# Patient Record
Sex: Female | Born: 1955 | Race: White | Hispanic: No | Marital: Married | Smoking: Current every day smoker
Health system: Southern US, Community
[De-identification: ages and names within clinical notes are randomized; demographics above are authoritative.]

## PROBLEM LIST (undated history)

## (undated) DIAGNOSIS — M1990 Unspecified osteoarthritis, unspecified site: Secondary | ICD-10-CM

## (undated) HISTORY — PX: TUBAL LIGATION: SHX77

## (undated) HISTORY — PX: BACK SURGERY: SHX140

---

## 2001-08-13 ENCOUNTER — Other Ambulatory Visit: Admission: RE | Admit: 2001-08-13 | Discharge: 2001-08-13 | Payer: Self-pay | Admitting: Obstetrics & Gynecology

## 2001-09-11 ENCOUNTER — Ambulatory Visit (HOSPITAL_COMMUNITY): Admission: RE | Admit: 2001-09-11 | Discharge: 2001-09-11 | Payer: Self-pay | Admitting: Family Medicine

## 2001-09-11 ENCOUNTER — Encounter: Payer: Self-pay | Admitting: Family Medicine

## 2001-10-18 ENCOUNTER — Ambulatory Visit (HOSPITAL_COMMUNITY): Admission: RE | Admit: 2001-10-18 | Discharge: 2001-10-18 | Payer: Self-pay | Admitting: Obstetrics & Gynecology

## 2002-08-17 ENCOUNTER — Emergency Department (HOSPITAL_COMMUNITY): Admission: EM | Admit: 2002-08-17 | Discharge: 2002-08-17 | Payer: Self-pay | Admitting: Emergency Medicine

## 2002-08-18 ENCOUNTER — Inpatient Hospital Stay (HOSPITAL_COMMUNITY): Admission: EM | Admit: 2002-08-18 | Discharge: 2002-08-22 | Payer: Self-pay | Admitting: Psychiatry

## 2003-07-07 ENCOUNTER — Ambulatory Visit (HOSPITAL_COMMUNITY): Admission: RE | Admit: 2003-07-07 | Discharge: 2003-07-07 | Payer: Self-pay | Admitting: Family Medicine

## 2004-01-30 ENCOUNTER — Ambulatory Visit (HOSPITAL_COMMUNITY): Admission: RE | Admit: 2004-01-30 | Discharge: 2004-01-30 | Payer: Self-pay | Admitting: General Surgery

## 2004-03-23 ENCOUNTER — Inpatient Hospital Stay (HOSPITAL_COMMUNITY): Admission: EM | Admit: 2004-03-23 | Discharge: 2004-03-26 | Payer: Self-pay | Admitting: Emergency Medicine

## 2004-04-06 ENCOUNTER — Ambulatory Visit (HOSPITAL_COMMUNITY): Admission: RE | Admit: 2004-04-06 | Discharge: 2004-04-06 | Payer: Self-pay | Admitting: Internal Medicine

## 2004-04-26 ENCOUNTER — Ambulatory Visit: Payer: Self-pay | Admitting: Orthopedic Surgery

## 2004-04-27 ENCOUNTER — Encounter (HOSPITAL_COMMUNITY): Admission: RE | Admit: 2004-04-27 | Discharge: 2004-05-27 | Payer: Self-pay | Admitting: Orthopedic Surgery

## 2004-06-01 ENCOUNTER — Encounter (HOSPITAL_COMMUNITY): Admission: RE | Admit: 2004-06-01 | Discharge: 2004-07-01 | Payer: Self-pay | Admitting: Orthopedic Surgery

## 2004-06-07 ENCOUNTER — Ambulatory Visit: Payer: Self-pay | Admitting: Orthopedic Surgery

## 2004-07-07 ENCOUNTER — Ambulatory Visit: Payer: Self-pay | Admitting: Orthopedic Surgery

## 2004-07-12 ENCOUNTER — Ambulatory Visit (HOSPITAL_COMMUNITY): Admission: RE | Admit: 2004-07-12 | Discharge: 2004-07-12 | Payer: Self-pay | Admitting: Orthopedic Surgery

## 2004-07-19 ENCOUNTER — Ambulatory Visit: Payer: Self-pay | Admitting: Orthopedic Surgery

## 2004-08-16 ENCOUNTER — Ambulatory Visit: Payer: Self-pay | Admitting: Orthopedic Surgery

## 2004-08-24 ENCOUNTER — Encounter: Admission: RE | Admit: 2004-08-24 | Discharge: 2004-08-24 | Payer: Self-pay | Admitting: Neurosurgery

## 2004-09-20 ENCOUNTER — Ambulatory Visit: Payer: Self-pay | Admitting: Orthopedic Surgery

## 2004-09-28 ENCOUNTER — Encounter (HOSPITAL_COMMUNITY): Admission: RE | Admit: 2004-09-28 | Discharge: 2004-10-28 | Payer: Self-pay | Admitting: Neurology

## 2004-10-25 ENCOUNTER — Ambulatory Visit: Payer: Self-pay | Admitting: Orthopedic Surgery

## 2004-11-02 ENCOUNTER — Encounter (HOSPITAL_COMMUNITY): Admission: RE | Admit: 2004-11-02 | Discharge: 2004-12-02 | Payer: Self-pay | Admitting: Neurology

## 2004-11-18 ENCOUNTER — Ambulatory Visit: Payer: Self-pay | Admitting: Orthopedic Surgery

## 2005-01-16 ENCOUNTER — Emergency Department (HOSPITAL_COMMUNITY): Admission: EM | Admit: 2005-01-16 | Discharge: 2005-01-16 | Payer: Self-pay | Admitting: Emergency Medicine

## 2006-05-30 ENCOUNTER — Ambulatory Visit (HOSPITAL_COMMUNITY): Admission: RE | Admit: 2006-05-30 | Discharge: 2006-05-30 | Payer: Self-pay | Admitting: Family Medicine

## 2006-05-31 ENCOUNTER — Ambulatory Visit (HOSPITAL_COMMUNITY): Admission: RE | Admit: 2006-05-31 | Discharge: 2006-05-31 | Payer: Self-pay | Admitting: Family Medicine

## 2006-06-19 ENCOUNTER — Ambulatory Visit (HOSPITAL_COMMUNITY): Admission: RE | Admit: 2006-06-19 | Discharge: 2006-06-19 | Payer: Self-pay | Admitting: Pulmonary Disease

## 2007-11-02 ENCOUNTER — Emergency Department (HOSPITAL_COMMUNITY): Admission: EM | Admit: 2007-11-02 | Discharge: 2007-11-02 | Payer: Self-pay | Admitting: Emergency Medicine

## 2008-03-20 ENCOUNTER — Ambulatory Visit (HOSPITAL_COMMUNITY): Admission: RE | Admit: 2008-03-20 | Discharge: 2008-03-20 | Payer: Self-pay | Admitting: Internal Medicine

## 2008-07-17 ENCOUNTER — Ambulatory Visit (HOSPITAL_COMMUNITY): Admission: RE | Admit: 2008-07-17 | Discharge: 2008-07-17 | Payer: Self-pay | Admitting: Family Medicine

## 2010-03-09 ENCOUNTER — Emergency Department (HOSPITAL_COMMUNITY): Admission: EM | Admit: 2010-03-09 | Discharge: 2010-03-09 | Payer: Self-pay | Admitting: Emergency Medicine

## 2010-03-18 ENCOUNTER — Emergency Department (HOSPITAL_COMMUNITY): Admission: EM | Admit: 2010-03-18 | Discharge: 2010-03-18 | Payer: Self-pay | Admitting: Emergency Medicine

## 2010-07-10 ENCOUNTER — Encounter: Payer: Self-pay | Admitting: Internal Medicine

## 2010-07-11 ENCOUNTER — Encounter: Payer: Self-pay | Admitting: Internal Medicine

## 2010-09-02 LAB — DIFFERENTIAL
Basophils Absolute: 0 10*3/uL (ref 0.0–0.1)
Basophils Absolute: 0.1 10*3/uL (ref 0.0–0.1)
Basophils Relative: 1 % (ref 0–1)
Eosinophils Absolute: 0.1 10*3/uL (ref 0.0–0.7)
Eosinophils Relative: 1 % (ref 0–5)
Monocytes Absolute: 0.2 10*3/uL (ref 0.1–1.0)
Neutro Abs: 3.4 10*3/uL (ref 1.7–7.7)
Neutrophils Relative %: 61 % (ref 43–77)

## 2010-09-02 LAB — COMPREHENSIVE METABOLIC PANEL
ALT: 10 U/L (ref 0–35)
AST: 16 U/L (ref 0–37)
CO2: 25 mEq/L (ref 19–32)
Chloride: 103 mEq/L (ref 96–112)
GFR calc Af Amer: >60 mL/min (ref >60)
GFR calc non Af Amer: >60 mL/min (ref >60)
Potassium: 3.5 mEq/L (ref 3.5–5.1)
Sodium: 131 mEq/L — ABNORMAL LOW (ref 135–145)
Total Bilirubin: 0.4 mg/dL (ref 0.3–1.2)

## 2010-09-02 LAB — CBC
HCT: 37.6 % (ref 36.0–46.0)
Hemoglobin: 11 g/dL — ABNORMAL LOW (ref 12.0–15.0)
MCHC: 33.9 g/dL (ref 30.0–36.0)
Platelets: 170 10*3/uL (ref 150–400)
RBC: 3.42 MIL/uL — ABNORMAL LOW (ref 3.87–5.11)
RDW: 14.4 % (ref 11.5–15.5)

## 2010-09-02 LAB — BASIC METABOLIC PANEL
BUN: 9 mg/dL (ref 6–23)
CO2: 25 mEq/L (ref 19–32)
Calcium: 9.6 mg/dL (ref 8.4–10.5)
Chloride: 109 mEq/L (ref 96–112)
Creatinine, Ser: 0.82 mg/dL (ref 0.4–1.2)
GFR calc Af Amer: >60 mL/min (ref >60)
GFR calc non Af Amer: >60 mL/min (ref >60)
Glucose, Bld: 91 mg/dL (ref 70–99)
Potassium: 3.9 mEq/L (ref 3.5–5.1)
Sodium: 141 mEq/L (ref 135–145)

## 2010-09-02 LAB — BLOOD GAS, ARTERIAL
Patient temperature: 37
TCO2: 21.6 mmol/L (ref 0–100)
pH, Arterial: 7.395 (ref 7.350–7.400)

## 2010-09-02 LAB — URINALYSIS, ROUTINE W REFLEX MICROSCOPIC
Glucose, UA: NEGATIVE mg/dL
Ketones, ur: 15 mg/dL — AB
Nitrite: NEGATIVE
pH: 6 (ref 5.0–8.0)

## 2010-09-02 LAB — URINE MICROSCOPIC-ADD ON

## 2010-09-02 LAB — D-DIMER, QUANTITATIVE: D-Dimer, Quant: 0.57 ug/mL-FEU — ABNORMAL HIGH (ref 0.00–0.48)

## 2010-09-02 LAB — LIPASE, BLOOD: Lipase: 21 U/L (ref 11–59)

## 2010-11-05 NOTE — H&P (Signed)
[facility]  Patient:    Valerie Bradley, Valerie Bradley Visit Number: [id number] MRN: [id number]          Service Type: OUT Location: [facility] Attending Physician:  Kirk Ruths Dictated by:   Duane Lope, M.D. Admit Date:  09/11/2001 Discharge Date: 09/11/2001                           History and Physical  DATE OF BIRTH:  02-06-1956  HISTORY OF PRESENT ILLNESS:  Valerie Bradley is a 55 year old, white female, G3, P3, status post tubal ligation in 1982, who is admitted for a hysteroscopy, D&C and endometrial ablation.  She was seen originally in our office on February 24, for perimenopausal dysfunctional bleeding, hypermenorrhea and dysmenorrhea.  She had an endometrial biopsy which basically showed disturbed phase endometrium with no hyperplasia consistent with dysfunctional uterine bleeding.  I put her on cyclical Provera as well as nonsteroidal antiinflammatory medications and was to follow her up in three months. However, she failed that therapy and presented as a work-in on September 25, 2001, at which time she was basically bleeding all the time on the Provera worse than she even was before.  Her cramps also remained pretty extreme despite the nonsteroidals.  As a result, she is admitted for the above-mentioned procedure.  PAST MEDICAL HISTORY:  Negative except for perimenstrual migraines.  PAST SURGICAL HISTORY: 1. Tubal ligation. 2. Tennis elbow surgery.  PAST OBSTETRIC HISTORY:  Three vaginal deliveries.  PAST GYNECOLOGIC HISTORY:  Sometimes her clotting can be the size of tennis balls and her cramps basically put her in the bed.  REVIEW OF SYSTEMS:  Otherwise negative, except the patient only has one kidney and was found at time of workup for numerous kidney infections.  FAMILY HISTORY:  Thyroid disease.  Epilepsy.  PHYSICAL EXAMINATION:  VITAL SIGNS:  Weight 110 pounds, blood pressure 120/80.  Her hemoglobin in February was 13.1.  By the first of  April, it had dropped to 11.5.  HEENT:  Unremarkable with normal thyroid.  LUNGS:  Clear.  HEART:  Regular rate and rhythm without murmurs, rubs or gallops.  BREASTS:  Without mass, discharge or skin changes.  ABDOMEN:  Benign hepatosplenomegaly or mass.  PELVIC:  She has normal external genitalia.  Cervix is parous without lesions. Uterus normal size, shape and contour.  Ovaries normal and nontender.  EXTREMITIES:  No edema.  NEUROLOGIC:  Grossly intact.  IMPRESSION: 1. Perimenopausal dysfunctional bleeding unresponsive to conservative    measures. 2. Anemia. 3. Dysmenorrhea.  PLAN:  The patient is admitted as an outpatient for outpatient surgery for a hysteroscopy, D&C and endometrial ablation.  She understands the risks, benefits, indications and alternatives for the procedure and will proceed. Dictated by:   Duane Lope, M.D. Attending Physician:  Kirk Ruths DD:  10/17/01 TD:  10/17/01 Job: [id number]

## 2010-11-05 NOTE — Op Note (Signed)
NAME:  Valerie Bradley, Valerie Bradley                          ACCOUNT NO.:  [id number]   MEDICAL RECORD NO.:  [id number]                   PATIENT TYPE:  AMB   LOCATION:  DAY                                  FACILITY:  [facility]   PHYSICIAN:  Barbaraann Barthel, M.D.              DATE OF BIRTH:  1955/10/12   DATE OF PROCEDURE:  01/30/2004  DATE OF DISCHARGE:                                 OPERATIVE REPORT   SURGEON:  Barbaraann Barthel, M.D.   PREOPERATIVE DIAGNOSIS:  Hidradenitis suppurativa, left axilla.   POSTOPERATIVE DIAGNOSIS:  Hidradenitis suppurativa, left axilla.   PROCEDURE:  Excision of hidradenitis suppurativa, left axilla.   SPECIMENS:  Cultures obtained and approximately a 6 x 4 area of excised  axillary tissue with inflamed apocrine glands.   INDICATIONS FOR PROCEDURE:  This is a 55 year old white female who presented  with a localized case of hidradenitis suppurativa of her left axilla.  She  was seen by the medical service, placed on antibiotics, and referred to  surgery.   We had planned for an excision of this area in the operating room and  discussed this in detail with the family.  We discussed complications, not  limited to bleeding, infection, and the possibility that more surgery may be  required.  Informed consent was obtained.   TECHNIQUE:  The patient was placed in the right lateral decubitus position  with her left arm elevated after adequate administration of LMA anesthesia.  The area was prepped with Betadine solution and draped in the usual manner.   The areas of suppuration were cultured, and cultures were sent.  An  elliptical incision was carried out around the area of localized  hidradenitis suppurativa.  We controlled the bleeding with the cautery  device and then irrigated this area and placed a saline-soaked 4 x 4 in this  area with an ABD pad as a dressing.  No drains were placed otherwise.  No  primary closure was attempted.   PLAN:  We will continue local  care until there is granulation tissue that is  adequate to accept a skin graft.  I have discussed this with the family.  Possibly, this may heal by secondary intention.  At any rate, we will  continue with  wound care.  The family is very familiar with this, as they have had similar  type of problems in the past.  At any rate, I will follow them closely  postoperatively after which they will return medically to Dr. Sherwood Gambler for any  problems they may have in the future.      ___________________________________________                                            Barbaraann Barthel, M.D.   WB/MEDQ  D:  01/30/2004  T:  01/30/2004  Job:  [id number]   cc:   Madelin Rear. Sherwood Gambler, M.D.  [address]  [place]  [place] [address]  Fax: [phone]

## 2010-11-05 NOTE — H&P (Signed)
NAME:  Valerie Bradley, Valerie Bradley NO.:  [id number]   MEDICAL RECORD NO.:  [id number]                   PATIENT TYPE:  IPS   LOCATION:  0300                                 FACILITY:  [facility]   PHYSICIAN:  Geoffery Lyons, M.D.                   DATE OF BIRTH:  11/12/1955   DATE OF ADMISSION:  08/18/2002  DATE OF DISCHARGE:                         PSYCHIATRIC ADMISSION ASSESSMENT   IDENTIFYING INFORMATION:  This is a voluntary admission for this 55 year old  married white female.  Identifying information is the patient and  accompanying records.   REASON FOR ADMISSION AND SYMPTOMS:  The patient reports depressive symptoms  as well as tension headaches for years.  She has been prescribed pain  medication and has been abusing them for the past 3 years.  She acknowledges  abusing Valium for the past 5 out of 11 years by taking 7 per day, also 7  Fioricet per day.  She takes her Lortabs 3 per day every day even if her  head is not hurting.  The patient reports being depressed from stressors  within her family, her marriage and her employment.  She has entertained  suicidal ideation recently but not at present.  She reports that last week  she decided that she needed to start weaning herself from her medications  and was cutting her Vicodin in half.  She was experiencing withdrawal  symptoms.  She was having stomach cramps and was irritable.  Apparently a  week ago, she had a migraine that lasted the whole weekend.  She tried to  get her Fioricet renewed.  Dr. Edison Simon office said that she had to come  in.  She went to Dr. Edison Simon office Monday but did not tell him she has  been continuing to get Vicodin and take Vicodin.  Apparently on Tuesday, a  complaint was made at her place of employment.  She is a school bus driver,  and on Thursday the patient woke up with severe stomach pains, diarrhea.  She states she took Imodium.  She also took double her dose of Vicodin so  that she would be able to drive the bus.  This was the day the snowstorm  came in and apparently she was asked to come off the bus and provide a urine  specimen.  The patient reports that she has had no promotion after 16 years  of service. She feels that there is favoritism.  There are younger  supervisors, etc.  She is requesting help to discontinue these medications  as she realizes that she has been abusing them and has become dependent.   PAST PSYCHIATRIC HISTORY:  She declares that she has never taken  antidepressants, nor has she had formal psychiatric care.  Her records  indicate that apparently at some point in time, a number of years ago, she  did have some panic attacks, but not  recently.   FAMILY HISTORY:  She has a brother who has a polysubstance abuse addiction.  She has a brother who has an alcohol problem, and another brother who uses  marijuana, and also a brother who has been diagnosed as bipolar.  She  herself has had no issues with alcohol.  She does smoke 1-1/2 packs of  cigarettes a day for 30 years.   SOCIAL HISTORY:  She has been married for 28 years.  She finished high  school.  She has worked as a Midwife for 16 years.  She has one son, 70-  1/2, another son 82, and a daughter 47.  She is currently having  relationship issues with her 38 year old daughter over choices she makes.  There is some stress within the marriage regarding a friendship she has with  another bus driver who is female.   PAST MEDICAL HISTORY:  Primary care Sebastian Dzik is Dr. Regino Schultze in [place].  Medical problems:  She only has one kidney.  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  Last April, she  underwent a D&C.  She was also told she had iron deficiency anemia by Dr.  Arbie Cookey and was prescribed iron replacement.   CURRENT MEDICATIONS:  Fioricet 325/40 mg 1 tab 4-6 hours as needed for  headaches, Lorcet 10/650 1-2 tabs every 4 hours, and  Valium 5 mg 1 tab 3  times each day.   ALLERGIES:  No known drug allergies.   POSITIVE PHYSICAL FINDINGS:  The patient is status post repair of tennis  elbow on the right in 1996 and she was seen last April and was found to have  iron deficiency anemia.   REVIEW OF SYSTEMS:  She has chronic tension headache.  She has undergone  several evaluations and tests have all come back negative, hence she has  been prescribed the Valium and the Fioricet.  She is known to have a left  eye astigmatism.  She has had pneumonia x2.  Her stomach hurts at times and  cramps, but no diarrhea or constipation.  The remainder of her review of  systems is unremarkable.  SKIN:  Her skin is dry but otherwise is intact.   PHYSICAL EXAMINATION:  A very thin, white female who appears older than her  stated age.  SKIN:  Wrinkled, consistent with her tobacco use.  HEENT:  Remarkable for the fact that she has had all her teeth extracted and  is fully compensated with dentures.  CHEST:  Clear to auscultation and percussion.  HEART:  Regular rate and rhythm without murmurs, rubs or gallops.  ABDOMEN:  Soft, no organomegaly, bowel sounds were present.  MUSCULOSKELETAL SYSTEM:  Reveals no clubbing, cyanosis, edema.  Pulses were  intact.  No bruits were noted.  NEUROLOGICALLY:  Cranial nerves II-XII are grossly intact.  Specifically,  she is not exhibiting tremor or other withdrawal symptoms at the moment.   MENTAL STATUS EXAM:  She is alert and oriented x3.  She is neatly dressed  and groomed.  She is somewhat anorectic appearing and does appear older than  her stated age.  Her speech was soft, with a normal rate and rhythm.  It had  no psychotic content.  Her mood is depressed, she is crying, she is somewhat  confused.  Her thought process is slightly scattered, with some persecutory overtones, but she denies auditory or visual hallucinations.  Cognitively,  her memory is intact, her  concentration is intact, she  has average  intelligence, and she has concrete abstractions.   ADMISSION DIAGNOSES:   AXIS I:  1. Major depressive disorder.  2. Prescription pain dependence/abuse.   AXIS II:  Deferred.   AXIS III:  Tension headache, recent iron deficiency anemia, status post  repair of tennis elbow.   AXIS IV:  Severe.  She is having problems with primary support group and  occupational problems.   AXIS V:  Global assessment of function is currently 43, in the past year she  is 80.    PLAN:  Admit for help with withdrawal from the prescription pain medications  that she has been abusing.  We will check her labs and continue to treat her  iron deficiency anemia if that is still a problem.  We will find other  methods to treat her tension headaches and we will have a social services  meeting with her husband, as well as contacting her place of employment, the  [employer] program there so that she will not lose her 16 years experience.      Mickie Adams- Deery, P.A.-C.              Geoffery Lyons, M.D.    MA/MEDQ  D:  08/18/2002  T:  08/18/2002  Job:  [id number]

## 2010-11-05 NOTE — Consult Note (Signed)
NAME:  REIS, PIENTA NO.:  [id number]   MEDICAL RECORD NO.:  [id number]            PATIENT TYPE:   LOCATION:                                 FACILITY:   PHYSICIAN:  Vickki Hearing, M.D.DATE OF BIRTH:  04/20/2006   DATE OF CONSULTATION:  03/25/2004  DATE OF DISCHARGE:                                   CONSULTATION   REQUESTING PHYSICIAN:  Madelin Rear. Sherwood Gambler, M.D.   CHIEF COMPLAINT:  Back and neck pain.   HISTORY:  This is a 55 year old female who fell off a horse, sustained an  injury to her left chest; had a flexion position of her spine when the horse  hit her in the head.  She had a questionable loss of consciousness with  dizziness.  She complains of midthoracic back pain and neck pain.  She got  back on the horse, rode it back to the barn and then presented to the office  when the pain did not go away.  She denies any radicular pain or neurologic  symptoms, although she had some nausea.  She does have a headache.  She has  no weakness in the upper extremities.   PAST, FAMILY, SOCIAL HISTORY, AND REVIEW OF SYSTEMS:  Otherwise  noncontributory.   PHYSICAL EXAMINATION:  GENERAL:  She is well-developed and nourished.  Grooming and hygiene are normal.  She is very thin.  She has a mild  kyphosis.  CARDIOVASCULAR EXAM:  Shows no swelling or varicosities.  Pulses are normal.  EXTREMITIES:  Warm without edema or tenderness.  LYMPH NODES:  Cervical lymph nodes are benign.  BACK:  Gait and station are normal except for the kyphotic posture in the  spine.  Tenderness is noted in the midthoracic region approximately T4-7.  She has decreased spinal extension, increased spinal flexion at the T4-7  level.  Her spine; however, is stable.  Muscle tone is normal.  Upper  extremities show no evidence of tenderness, loss of motion, instability,  muscle tone, or strength deficit.  SKIN:  Normal.  NEUROLOGIC:  Exam is intact.  She is awake and alert.  There is no  anxiety.   RADIOGRAPHS:  Include cervical spine x-ray, thoracic spine x-ray, MRI C-  spine and thoracic spine.  She has a C5-C6 protruding disk with no  neurologic compression.  There is some pressure on the thecal sac.  The  nerve roots are clean.  There is a 20-30% compression fracture at T4.  There  is some edema in the thoracic vertebrae numbers 3 and 2.   DIAGNOSES:  Compression fracture thoracic spine at T4, C5-6 disk   RECOMMEND:  A CASH brace and cervical collar.  Follow up with me in 6 weeks  for x-rays.     Weyman Croon   SEH/MEDQ  D:  03/25/2004  T:  03/25/2004  Job:  [id number]   cc:   Madelin Rear. Sherwood Gambler, MD  [address]  [place]  [place] [address]  Fax: [phone]   Vickki Hearing, M.D.  Fax: [phone]

## 2010-11-05 NOTE — Op Note (Signed)
[facility]  Patient:    Valerie Bradley, Valerie Bradley Visit Number: [id number] MRN: [id number]          Service Type: DSU Location: DAY Attending Physician:  Lazaro Arms Dictated by:   Duane Lope, M.D. Proc. Date: 10/18/01 Admit Date:  10/18/2001                             Operative Report  PREOPERATIVE DIAGNOSES:  1. Perimenopausal dysfunctional uterine bleeding uncontrolled by     conservative measures.  2. Dysmenorrhea.  3. Anemia.  POSTOPERATIVE DIAGNOSES:  1. Perimenopausal dysfunctional uterine bleeding uncontrolled by     conservative measures.  2. Dysmenorrhea.  3. Anemia.  PROCEDURE:  Hysteroscopy D&C with a thermal endometrial oblation.  SURGEON:  Duane Lope, M.D.  ANESTHESIA:  Laryngeal mask airway general.  FINDINGS:  The patient had no polyps or submucosal myomas hysteroscopy. She had sort of a fluffy endometrium with quite a large amount of curettings all of which appeared to be normal despite having been on Provera. Again there were no areas of suspicion on endometrium.  DESCRIPTION OF PROCEDURE:  The patient was taken to the operating room and placed in the supine position where she underwent laryngeal mask airway and general anesthesia. She was placed in the dorsal lithotomy position and prepped and draped in the usual sterile fashion. Her bladder was drained, a speculum was placed. Half percent Marcaine plain was placed as a paracervical block bilaterally, a total of 24 cc. The cervix was grasped with a single tooth tenaculum. The cervix was dilated serially. After uterine sounding took place, it sounded to 8 cm. This was done to allow the passage of a 5 mm hysteroscope. Normal saline was used for a distending media. The above noted hysteroscopic findings were noted and pictures were taken. Again there were no abnormalities. The cervix was then dilated serially to allow passage of a medium to large size uterine curette. A vigorous  uterine curettage was performed obtaining good cry in all areas. Once this was completed there was good hemostasis. The endometrial oblation was then performed using the thermochoice instrument. The catheter was primed, taken to a negative pressure of -150 and then it require 15 cc of fluid as it was placed in the endometrial cavity in the balloon to maintain a pressure of approximately 170 preheating and then the contents of the balloon were heated to a temperature of 87 degrees celsius for a total period of 8 minutes. Total therapy time was 8 minutes and 59 seconds. This was allowed to cool down. The balloon was evacuated and removed from the uterine cavity. The patient tolerated the procedure well. She experienced minimal blood loss and was taken to the recovery room in good stable condition. All counts were correct. Dictated by:   Duane Lope, M.D. Attending Physician:  Lazaro Arms DD:  10/18/01 TD:  10/19/01 Job: [id number]

## 2010-11-05 NOTE — Discharge Summary (Signed)
NAME:  Valerie Bradley, MELNIK NO.:  [id number]   MEDICAL RECORD NO.:  [id number]                   PATIENT TYPE:  IPS   LOCATION:  0300                                 FACILITY:  [facility]   PHYSICIAN:  Jeanice Lim, M.D.              DATE OF BIRTH:  1956-04-04   DATE OF ADMISSION:  08/18/2002  DATE OF DISCHARGE:  08/22/2002                                 DISCHARGE SUMMARY   IDENTIFYING DATA:  This is a 55 year old Caucasian female presenting for  help to detoxify off of Vicodin, Fioricet, Valium, which she had been taking  for 11 years.  She had been abusing medications and experiencing withdrawal  symptoms.  She was feeling desperate and unable to stop medications in the  outpatient setting safely.   MEDICATIONS:  1. Valium 5 mg, up to seven a day.  2. Vicodin q.4h.  3. Fioricet one to two q.4h.  The patient had been taking more than     prescribed.   DRUG ALLERGIES:  No known drug allergies.   PHYSICAL EXAMINATION:  GENERAL:  Essentially within normal limits.  NEUROLOGIC:  Nonfocal.   LABORATORY DATA:  Routine admission labs: CBC: Within normal limits.  CMET:  Within normal limits except for a slightly elevated glucose of 111.  Liver  function tests were within normal limits.  TSH: Within normal limits.   MENTAL STATUS EXAM:  Alert and oriented x 3, neatly dressed, well groomed  female.  Speech was within normal limits.  Mood: Depressed, tearful.  Affect: Somewhat labile.  Thought process: Somewhat scattered.  The patient  reported feeling uncomfortable, some withdrawal symptoms.  Cognitive:  Intact.  Judgment and insight: Fair.   ADMISSION DIAGNOSES:   AXIS I:  1. Major depressive disorder.  2. Opiate dependence.   AXIS II:  None.   AXIS III:  1. Tension headache.  2. Recent iron-deficiency anemia.   AXIS IV:  Moderate problems with occupation and primary support system.   AXIS V:  40/70   HOSPITAL COURSE:  The patient was admitted,  ordered routine p.r.n.  medications, underwent further monitoring, and was encouraged to participate  in individual, group, and milieu therapy.  The patient was placed on  detoxification protocol for opiates and benzodiazepines and was monitored  for withdrawal symptoms.  The patient reported feeling guilty about having  hurt husband, reported tolerating detoxification without significant  complications.  Withdrawal symptoms gradually decreased in severity.  The  patient admitted to being depressed and was quite motivated to remain sober  and was willing to follow up with the [facility] after discharge.  The patient reported a positive  response to clinical intervention.   CONDITION ON DISCHARGE:  Her condition on discharge was markedly improved.  Mood was more euthymic.  Affect: Brighter.  Thought processes: Goal  directed.  Thought content: Negative for dangerous  ideation or psychotic  symptoms.  The patient was motivation to be abstinent from substances of  abuse and had no acute withdrawal symptoms.   DISCHARGE MEDICATIONS:  1. Zoloft 50 mg q.a.m.  2. Librium 25 mg t.i.d. for one day, then b.i.d. for one day, then one at     night for one day, then stop.  3. Trazodone 100 mg q.h.s.  4. Midrin two capsules p.r.n. onset of headache and one every two hours up     to a maximum of six in a 24 hour period.   FOLLOW UP:  The patient was to follow up with the [facility] clinic on March 5  at 4 p.m.   DISCHARGE DIAGNOSES:   AXIS I:  1. Major depressive disorder.  2. Opiate dependence.   AXIS II:  None.   AXIS III:  1. Tension headache.  2. Recent iron-deficiency anemia.   AXIS IV:  Moderate problems with occupation and primary support system.   AXIS V:  Global assessment of functioning on discharge was 55.                                               Jeanice Lim, M.D.    JEM/MEDQ  D:  09/10/2002  T:  09/10/2002  Job:   [id number]

## 2010-11-05 NOTE — H&P (Signed)
NAME:  Valerie Bradley, Valerie Bradley                ACCOUNT NO.:  [id number]   MEDICAL RECORD NO.:  [id number]          PATIENT TYPE:  EMS   LOCATION:  ED                            FACILITY:  [facility]   PHYSICIAN:  Madelin Rear. Fusco, M.D.DATE OF BIRTH:  03/17/1956   DATE OF ADMISSION:  03/23/2004  DATE OF DISCHARGE:  LH                                HISTORY & PHYSICAL   CHIEF COMPLAINT:  Back pain, neck pain.   HISTORY OF PRESENT ILLNESS:  Several days prior to office visit, the patient  fell off of a horse sustaining an injury to her left chest.  She also had  the horse's head come up and abruptly strike her on the frontal eminence  with a quasi loss of consciousness and dizziness.  Following that, a fall  occurred, where she strained her neck and hurt her back.  She was able to  get back on the horse and ride it back to the barn.  However, she had a  persistent pain and presented to the office in that fashion.  She denied any  radicular pain or neurologic symptoms, although she did have some nausea.  She has a persistent headache as well.   PAST MEDICAL HISTORY:  Noncontributory.   SOCIAL HISTORY:  Noncontributory.   FAMILY HISTORY:  Noncontributory.   REVIEW OF SYSTEMS:  Under HPI.  All else is negative.   PHYSICAL EXAMINATION:  GENERAL:  She is awake and alert, although somewhat  bradykinetic secondary to obvious headache.  HEENT:  She has a small, soft tissue swelling over the glabella and right  frontal eminence.  No hematoma was palpable.  NECK:  Limited range of motion in the office with persistent pain in all  range of motion testing.  UPPER EXTREMITIES:  DTRs, motor and sensory examinations as well as the  lower extremities were normal.  CHEST:  Left rib tenderness over the left lateral lower chest wall.  No  crepitus.  Breath sounds were clear bilaterally.  CARDIAC:  Regular rhythm without murmurs, gallops or rubs.  ABDOMEN:  Soft.  No organomegaly or masses.  No guarding, rebound  or  tenderness.  MUSCULOSKELETAL:  Point tenderness over the T4 spinous process.   LABORATORY STUDIES:  The patient was sent to the emergency department for  urgent lab studies.  A CT of the head was negative for acute intracranial  process.  Plain cervical C spine series were negative.  T spine series did  show a compression wedge deformity at T4 with no clear cut edema.  CT of the  chest showed no acute disease, per radiology interpretation.  Blood work was  obtained, which was negative.  The amylase and lipase are pending at  present.  Urinalysis is also pending.   IMPRESSION:  1.  Multiple contusions with T4 compression fracture - Admit for pain      control, MRI.  Rule out cervicogenic disc disease with another MRI of      the neck, planned for the morning if possible.  2.  Closed head injury - Monitor neurologic signs and exam.  P.r.n.      analgesia.      LJF/MEDQ  D:  03/23/2004  T:  03/23/2004  Job:  [id number]

## 2010-11-05 NOTE — Discharge Summary (Signed)
NAME:  Valerie Bradley, Valerie Bradley                ACCOUNT NO.:  [id number]   MEDICAL RECORD NO.:  [id number]          PATIENT TYPE:  INP   LOCATION:  A339                          FACILITY:  [facility]   PHYSICIAN:  Madelin Rear. Sherwood Gambler, MD  DATE OF BIRTH:  Feb 11, 1956   DATE OF ADMISSION:  03/23/2004  DATE OF DISCHARGE:  10/07/2005LH                                 DISCHARGE SUMMARY   DISCHARGE DIAGNOSES:  1.  Fracture of T spine, compression type, closed.  2.  Cervicogenic disk disease.  3.  Multiple contusions including chest wall and possible occult rib      fractures.  4.  Closed-head injury with postconcussive symptomatology.   DISCHARGE MEDICATIONS:  1.  Valium 10 mg q. 6h. p.r.n.  2.  Lorcet 10 mg q.6h. p.r.n.   SUMMARY:  The patient was a rider of a horse, sustaining an injury to her  left chest after being hit in the head by the horses head.  She had possibly  a brief loss of consciousness, at least antegrade amnesia for the event, and  persisted with severe pain in the back, left chest, headache, malaise, and  nausea.  She was admitted for observation to rule out subdural hematoma or  other cranial pathology as well as internal injuries from the fall.  She  responded well to interventions with analgesia and serial assessments being  negative.  X-rays revealed fractures of T4, compression type, and she was  seen in consultation by orthopedics for that.   She is discharged for followup in our office after adequate analgesia was  obtained orally.     Lawr   LJF/MEDQ  D:  04/09/2004  T:  04/09/2004  Job:  [id number]

## 2012-12-03 ENCOUNTER — Other Ambulatory Visit (HOSPITAL_COMMUNITY): Payer: Self-pay | Admitting: Internal Medicine

## 2012-12-03 DIAGNOSIS — S7001XA Contusion of right hip, initial encounter: Secondary | ICD-10-CM

## 2012-12-11 ENCOUNTER — Ambulatory Visit (HOSPITAL_COMMUNITY)
Admission: RE | Admit: 2012-12-11 | Discharge: 2012-12-11 | Disposition: A | Discharge status: Home / Self Care | Payer: BC Managed Care – PPO | Source: Ambulatory Visit | Attending: Internal Medicine | Admitting: Internal Medicine

## 2012-12-11 DIAGNOSIS — S7001XA Contusion of right hip, initial encounter: Secondary | ICD-10-CM

## 2012-12-11 DIAGNOSIS — M25559 Pain in unspecified hip: Secondary | ICD-10-CM | POA: Insufficient documentation

## 2012-12-11 DIAGNOSIS — S7000XA Contusion of unspecified hip, initial encounter: Secondary | ICD-10-CM | POA: Insufficient documentation

## 2012-12-11 DIAGNOSIS — X58XXXA Exposure to other specified factors, initial encounter: Secondary | ICD-10-CM | POA: Insufficient documentation

## 2013-03-21 ENCOUNTER — Emergency Department (HOSPITAL_COMMUNITY): Payer: BC Managed Care – PPO

## 2013-03-21 ENCOUNTER — Encounter (HOSPITAL_COMMUNITY): Payer: Self-pay | Admitting: *Deleted

## 2013-03-21 ENCOUNTER — Emergency Department (HOSPITAL_COMMUNITY)
Admission: EM | Admit: 2013-03-21 | Discharge: 2013-03-21 | Disposition: A | Discharge status: Home / Self Care | Payer: BC Managed Care – PPO | Attending: Emergency Medicine | Admitting: Emergency Medicine

## 2013-03-21 DIAGNOSIS — Y92009 Unspecified place in unspecified non-institutional (private) residence as the place of occurrence of the external cause: Secondary | ICD-10-CM | POA: Insufficient documentation

## 2013-03-21 DIAGNOSIS — W540XXA Bitten by dog, initial encounter: Secondary | ICD-10-CM | POA: Insufficient documentation

## 2013-03-21 DIAGNOSIS — Z79899 Other long term (current) drug therapy: Secondary | ICD-10-CM | POA: Insufficient documentation

## 2013-03-21 DIAGNOSIS — S51809A Unspecified open wound of unspecified forearm, initial encounter: Secondary | ICD-10-CM | POA: Insufficient documentation

## 2013-03-21 DIAGNOSIS — S41151A Open bite of right upper arm, initial encounter: Secondary | ICD-10-CM

## 2013-03-21 DIAGNOSIS — Y9389 Activity, other specified: Secondary | ICD-10-CM | POA: Insufficient documentation

## 2013-03-21 DIAGNOSIS — S61409A Unspecified open wound of unspecified hand, initial encounter: Secondary | ICD-10-CM | POA: Insufficient documentation

## 2013-03-21 DIAGNOSIS — Z23 Encounter for immunization: Secondary | ICD-10-CM | POA: Insufficient documentation

## 2013-03-21 DIAGNOSIS — M129 Arthropathy, unspecified: Secondary | ICD-10-CM | POA: Insufficient documentation

## 2013-03-21 DIAGNOSIS — F17200 Nicotine dependence, unspecified, uncomplicated: Secondary | ICD-10-CM | POA: Insufficient documentation

## 2013-03-21 DIAGNOSIS — S61452A Open bite of left hand, initial encounter: Secondary | ICD-10-CM

## 2013-03-21 HISTORY — DX: Unspecified osteoarthritis, unspecified site: M19.90

## 2013-03-21 MED ORDER — RABIES VACCINE, PCEC IM SUSR
1.0000 mL | Freq: Once | INTRAMUSCULAR | Status: AC
  Administered 2013-03-21: 1 mL via INTRAMUSCULAR

## 2013-03-21 MED ORDER — HYDROCODONE-ACETAMINOPHEN 5-325 MG PO TABS: 1.0000 | ORAL_TABLET | Freq: Four times a day (QID) | ORAL | Status: DC | PRN

## 2013-03-21 MED ORDER — LIDOCAINE-EPINEPHRINE (PF) 2 %-1:200000 IJ SOLN
INTRAMUSCULAR | Status: AC
  Administered 2013-03-21: 21:00:00
  Filled 2013-03-21: qty 20

## 2013-03-21 MED ORDER — RABIES IMMUNE GLOBULIN 150 UNIT/ML IM INJ
20.0000 [IU]/kg | INJECTION | Freq: Once | INTRAMUSCULAR | Status: AC
  Administered 2013-03-21: 975 [IU] via INTRAMUSCULAR
  Filled 2013-03-21: qty 8

## 2013-03-21 MED ORDER — DOUBLE ANTIBIOTIC 500-10000 UNIT/GM EX OINT
TOPICAL_OINTMENT | Freq: Once | CUTANEOUS | Status: AC
  Administered 2013-03-21: 21:00:00 via TOPICAL
  Filled 2013-03-21: qty 1

## 2013-03-21 MED ORDER — HYDROCODONE-ACETAMINOPHEN 5-325 MG PO TABS
1.0000 | ORAL_TABLET | Freq: Once | ORAL | Status: AC
  Administered 2013-03-21: 1 via ORAL
  Filled 2013-03-21: qty 1

## 2013-03-21 MED ORDER — AMOXICILLIN-POT CLAVULANATE 875-125 MG PO TABS: 1.0000 | ORAL_TABLET | Freq: Two times a day (BID) | ORAL | Status: DC

## 2013-03-21 NOTE — ED Notes (Signed)
Pt states she went to mailbox at her home and that she has a long drive way, states a dog came from som brushes and bit her right forearm, pt used left hand to push dog and was able to kick the dog off, pt denies ever seeing the dog and states dog did not have a collar on, [employer] called and asked to send [employer] to speak with pt concerning dog bite

## 2013-03-21 NOTE — ED Notes (Signed)
MD at bedside. 

## 2013-03-21 NOTE — ED Notes (Signed)
[employer] at bedside to speak with pt.  

## 2013-03-21 NOTE — ED Provider Notes (Signed)
CSN: [id number]     Arrival date & time 03/21/13  1708 History   First MD Initiated Contact with Patient 03/21/13 1744     Chief Complaint  Patient presents with  . Animal Bite   (Consider location/radiation/quality/duration/timing/severity/associated sxs/prior Treatment) Patient is a 57 y.o. female presenting with animal bite. The history is provided by the patient.  Animal Bite Associated symptoms: rash   Associated symptoms: no fever    patient status post attack by a dog at approximately 4:00 in the afternoon unprovoked animal not known to the [place] Patient's tetanus is up-to-date. The patient grabbed her right forearm causing laceration patient kicked the dog and hit the dog has a small laceration to her left hand. No other injuries. [facility] department has been notified and marrow looking for the dog.  Past Medical History  Diagnosis Date  . Arthritis    Past Surgical History  Procedure Laterality Date  . Tubal ligation    . Back surgery     No family history on file. History  Substance Use Topics  . Smoking status: Current Every Day Smoker -- 0.50 packs/day  . Smokeless tobacco: Not on file  . Alcohol Use: Not on file   OB History   Grav Para Term Preterm Abortions TAB SAB Ect Mult Living                 Review of Systems  Constitutional: Negative for fever.  HENT: Negative for congestion.   Eyes: Negative for redness.  Respiratory: Negative for shortness of breath.   Cardiovascular: Negative for chest pain.  Gastrointestinal: Negative for nausea, vomiting and abdominal pain.  Genitourinary: Negative for dysuria.  Musculoskeletal: Negative for back pain.  Skin: Positive for rash and wound.  Neurological: Negative for headaches.  Hematological: Does not bruise/bleed easily.  Psychiatric/Behavioral: Negative for confusion.    Allergies  Review of patient's allergies indicates no known allergies.  Home Medications   Current  Outpatient Rx  Name  Route  Sig  Dispense  Refill  . diazepam (VALIUM) 10 MG tablet   Oral   Take 10 mg by mouth at bedtime.         Marland Kitchen amoxicillin-clavulanate (AUGMENTIN) 875-125 MG per tablet   Oral   Take 1 tablet by mouth every 12 (twelve) hours.   14 tablet   0   . HYDROcodone-acetaminophen (NORCO/VICODIN) 5-325 MG per tablet   Oral   Take 1-2 tablets by mouth every 6 (six) hours as needed for pain.   10 tablet   0    BP 103/50  Pulse 110  Temp(Src) 98.1 F (36.7 C) (Oral)  Resp 18  Ht 5\' 2"  (1.575 m)  Wt 106 lb (48.081 kg)  BMI 19.38 kg/m2  SpO2 94% Physical Exam  Nursing note and vitals reviewed. Constitutional: She is oriented to person, place, and time. She appears well-developed and well-nourished. No distress.  HENT:  Head: Normocephalic and atraumatic.  Mouth/Throat: Oropharynx is clear and moist.  Eyes: Conjunctivae are normal. Pupils are equal, round, and reactive to light.  Neck: Normal range of motion.  Cardiovascular: Normal rate, regular rhythm and normal heart sounds.   Pulmonary/Chest: Effort normal and breath sounds normal. No respiratory distress.  Abdominal: Soft. Bowel sounds are normal. There is no tenderness.  Musculoskeletal: She exhibits tenderness.  Patient with left hand with superficial 1 cm laceration over the fourth and fifth metacarpal area. Wound is closed. Good range of motion of left  hand. Right forearm with 2 puncture areas about 5 mm each and 2 lacerations one measuring 4 cm the other measuring about 3 cm. This has skin tears with exposure of underlying structures but no evidence of any tendon injury or venous injury or tear or injury. Does not seem to involve the muscle. Distally patient has some discomfort with range of motion of the fingers sensation is intact in motion is intact. Radial pulses 2+ Refill is 1 second.  Neurological: She is alert and oriented to person, place, and time. No cranial nerve deficit. She exhibits normal  muscle tone. Coordination normal.  Skin: Skin is warm. No rash noted.    ED Course  Procedures (including critical care time) Labs Review Labs Reviewed - No data to display Imaging Review Dg Forearm Right  03/21/2013   CLINICAL DATA:  Dog bite.  EXAM: RIGHT FOREARM - 2 VIEW  COMPARISON:  None.  FINDINGS: Multiple soft tissue injuries are noted. There is air in the soft tissues from penetrating wounds. No acute fracture or radiopaque foreign body.  IMPRESSION: Significant soft tissue injuries but no fracture or foreign body.   Electronically Signed   By: Loralie Champagne M.D.   On: 03/21/2013 18:45   Dg Hand Complete Left  03/21/2013   CLINICAL DATA:  Dog bite.  EXAM: LEFT HAND - COMPLETE 3+ VIEW  COMPARISON:  None.  FINDINGS: The joint spaces are maintained. No acute fracture or radiopaque foreign body other than the patient's rings.  IMPRESSION: No acute fracture.   Electronically Signed   By: Loralie Champagne M.D.   On: 03/21/2013 18:44    MDM   1. Dog bite of arm, right, initial encounter   2. Dog bite of hand, left, initial encounter    Status post dog bite for Dominic to the right forearm with the 2 puncture areas and 2 laceration wounds one measuring 3-4 cm the other 3 cm. These were closed by sutures by the mid-level. Patient received rabies immunoglobulin and rabies vaccine first dose here. X-rays of the area show no evidence of foreign body or bony injury. The laceration to the left hand does not require any suturing or any treatment other than local wound care. Patient will be started on Augmentin patient will have a recheck of her wounds in 2 days. Patient had the sutures removed in 7-10 days. On superficial excoriation of the wounds no evidence of any direct tendon injury no significant bleeding. Patient is very thin so these wounds are amenable more to suturing and stapling. She has really no body fat.  Medical screening examination/treatment/procedure(s) were conducted as a shared  visit with non-physician practitioner(s) and myself.  I personally evaluated the patient during the encounter    Shelda Jakes, MD 03/21/13 [phone]

## 2013-03-21 NOTE — ED Notes (Signed)
Dog bite to right forearm, states she was bitten by an unknown dog in her driveway

## 2013-03-21 NOTE — ED Provider Notes (Signed)
LACERATION REPAIR Performed by: Burgess Amor Authorized by: Burgess Amor Consent: Verbal consent obtained. Risks and benefits: risks, benefits and alternatives were discussed Consent given by: patient Patient identity confirmed: provided demographic data Prepped and Draped in normal sterile fashion Wound explored  Laceration Location: right forearm  Laceration Length: multiple lacerations right forearm,  Longest 4 cm,  Irregular, second 2 cm, linear and one puncture.  No Foreign Bodies seen or palpated  Anesthesia: local infiltration  Local anesthetic: lidocaine 2% with epinephrine  Anesthetic total: 8 ml  Irrigation method: syringe Amount of cleaning: copious using a full liter of NS after washing the wounds with betadine.  Skin closure: ethilon 4-0,  Simple interrupted.  Number of sutures: #7, #3 and #1 respectively.  Technique: simple interrupted.  Patient tolerance: Patient tolerated the procedure well with no immediate complications.  Polysporin applied,  Dressing applied.   Burgess Amor, PA-C 03/21/13 2118

## 2013-03-22 NOTE — ED Provider Notes (Signed)
Medical screening examination/treatment/procedure(s) were conducted as a shared visit with non-physician practitioner(s) and myself.  I personally evaluated the patient during the encounter  Shelda Jakes, MD 03/22/13 1755

## 2013-03-25 ENCOUNTER — Encounter (HOSPITAL_COMMUNITY)
Admission: RE | Admit: 2013-03-25 | Discharge: 2013-03-25 | Disposition: A | Discharge status: Home / Self Care | Payer: BC Managed Care – PPO | Source: Ambulatory Visit | Attending: Emergency Medicine | Admitting: Emergency Medicine

## 2013-03-25 DIAGNOSIS — Z23 Encounter for immunization: Secondary | ICD-10-CM | POA: Insufficient documentation

## 2013-03-25 MED ORDER — RABIES VACCINE, PCEC IM SUSR
1.0000 mL | Freq: Once | INTRAMUSCULAR | Status: AC
  Administered 2013-03-25: 1 mL via INTRAMUSCULAR
  Filled 2013-03-25: qty 1

## 2013-03-29 ENCOUNTER — Encounter (HOSPITAL_COMMUNITY)
Admission: RE | Admit: 2013-03-29 | Discharge: 2013-03-29 | Disposition: A | Discharge status: Home / Self Care | Payer: BC Managed Care – PPO | Source: Ambulatory Visit | Attending: Emergency Medicine | Admitting: Emergency Medicine

## 2013-03-29 NOTE — Progress Notes (Signed)
No show for [name],called and son stated Valerie Bradley was sick with UTI and would not be coming for injection. Informed son of ramifications of missed dose, verbalized understanding.

## 2015-11-24 ENCOUNTER — Emergency Department (HOSPITAL_COMMUNITY)
Admission: EM | Admit: 2015-11-24 | Discharge: 2015-11-24 | Disposition: A | Discharge status: Home / Self Care | Payer: BC Managed Care – PPO | Attending: Emergency Medicine | Admitting: Emergency Medicine

## 2015-11-24 ENCOUNTER — Encounter (HOSPITAL_COMMUNITY): Payer: Self-pay | Admitting: Emergency Medicine

## 2015-11-24 ENCOUNTER — Emergency Department (HOSPITAL_COMMUNITY): Payer: BC Managed Care – PPO

## 2015-11-24 DIAGNOSIS — M1990 Unspecified osteoarthritis, unspecified site: Secondary | ICD-10-CM | POA: Insufficient documentation

## 2015-11-24 DIAGNOSIS — S99921A Unspecified injury of right foot, initial encounter: Secondary | ICD-10-CM | POA: Diagnosis present

## 2015-11-24 DIAGNOSIS — Z79899 Other long term (current) drug therapy: Secondary | ICD-10-CM | POA: Insufficient documentation

## 2015-11-24 DIAGNOSIS — Y999 Unspecified external cause status: Secondary | ICD-10-CM | POA: Insufficient documentation

## 2015-11-24 DIAGNOSIS — W208XXA Other cause of strike by thrown, projected or falling object, initial encounter: Secondary | ICD-10-CM | POA: Insufficient documentation

## 2015-11-24 DIAGNOSIS — Y939 Activity, unspecified: Secondary | ICD-10-CM | POA: Diagnosis not present

## 2015-11-24 DIAGNOSIS — Y929 Unspecified place or not applicable: Secondary | ICD-10-CM | POA: Diagnosis not present

## 2015-11-24 DIAGNOSIS — F17200 Nicotine dependence, unspecified, uncomplicated: Secondary | ICD-10-CM | POA: Insufficient documentation

## 2015-11-24 DIAGNOSIS — S9031XA Contusion of right foot, initial encounter: Secondary | ICD-10-CM | POA: Insufficient documentation

## 2015-11-24 MED ORDER — NAPROXEN 500 MG PO TABS: 500.0000 mg | ORAL_TABLET | Freq: Once | ORAL | Status: DC

## 2015-11-24 MED ORDER — NAPROXEN 250 MG PO TABS
500.0000 mg | ORAL_TABLET | Freq: Once | ORAL | Status: AC
  Administered 2015-11-24: 500 mg via ORAL
  Filled 2015-11-24: qty 2

## 2015-11-24 NOTE — Discharge Instructions (Signed)

## 2015-11-24 NOTE — Progress Notes (Signed)
Pt removed gold anklet and put in her pants pocket prior to imaging of Rt foot.

## 2015-11-24 NOTE — ED Provider Notes (Signed)
CSN: [id number]     Arrival date & time 11/24/15  4098 History   First MD Initiated Contact with Patient 11/24/15 [phone]     Chief Complaint  Patient presents with  . Foot Pain     (Consider location/radiation/quality/duration/timing/severity/associated sxs/prior Treatment) HPI  This is a 60 year old female who presents with injury to the right foot. He reports that she was moving a headboard when it fell and hit her right foot. Weight approximately 35 pounds. This happened he p.m. last night. She reports increasing pain to the right foot. She has been ambulatory. Denies other injury. She reports that she is at recovering narcotic addict and would like to avoid any narcotic pain medication. She's not taken anything for her pain. She reports pain is 10 out of 10.  Past Medical History  Diagnosis Date  . Arthritis    Past Surgical History  Procedure Laterality Date  . Tubal ligation    . Back surgery     History reviewed. No pertinent family history. Social History  Substance Use Topics  . Smoking status: Current Every Day Smoker -- 0.50 packs/day  . Smokeless tobacco: None  . Alcohol Use: Yes   OB History    No data available     Review of Systems  Musculoskeletal:       Foot pain  Skin: Positive for wound.  Neurological: Negative for numbness.  All other systems reviewed and are negative.     Allergies  Review of patient's allergies indicates no known allergies.  Home Medications   Prior to Admission medications   Medication Sig Start Date End Date Taking? Authorizing Provider  buprenorphine-naloxone (SUBOXONE) 8-2 MG SUBL SL tablet Place 1 tablet under the tongue 2 (two) times daily at 10 AM and 5 PM.   Yes Historical Provider, MD  amoxicillin-clavulanate (AUGMENTIN) 875-125 MG per tablet Take 1 tablet by mouth every 12 (twelve) hours. 03/21/13   Vanetta Mulders, MD  diazepam (VALIUM) 10 MG tablet Take 10 mg by mouth at bedtime.    Historical Provider, MD   HYDROcodone-acetaminophen (NORCO/VICODIN) 5-325 MG per tablet Take 1-2 tablets by mouth every 6 (six) hours as needed for pain. 03/21/13   Vanetta Mulders, MD  naproxen (NAPROSYN) 500 MG tablet Take 1 tablet (500 mg total) by mouth once. 11/24/15   Shon Baton, MD   BP 115/60 mmHg  Pulse 96  Temp(Src) 98.4 F (36.9 C) (Oral)  Resp 18  Ht 5' (1.524 m)  Wt 115 lb (52.164 kg)  BMI 22.46 kg/m2  SpO2 94% Physical Exam  Constitutional: She is oriented to person, place, and time. She appears well-developed and well-nourished. No distress.  HENT:  Head: Normocephalic and atraumatic.  Cardiovascular: Normal rate and regular rhythm.   Pulmonary/Chest: Effort normal. No respiratory distress.  Musculoskeletal:  Tenderness palpation over the proximal midfoot with associated erythema and contusion, normal flexion and extension of the toes and ankle, no obvious deformities  Neurological: She is alert and oriented to person, place, and time.  Skin: Skin is warm and dry.  Psychiatric: She has a normal mood and affect.  Nursing note and vitals reviewed.   ED Course  Procedures (including critical care time) Labs Review Labs Reviewed - No data to display  Imaging Review Dg Foot Complete Right  11/24/2015  CLINICAL DATA:  Right foot pain after dropping a bed frame on at tonight. Initial encounter. EXAM: RIGHT FOOT COMPLETE - 3+ VIEW COMPARISON:  None. FINDINGS: There is no evidence of fracture  or dislocation. There is no evidence of arthropathy or other focal bone abnormality. Soft tissues are unremarkable. IMPRESSION: Negative. Electronically Signed   By: Marnee SpringJonathon  Watts M.D.   On: 11/24/2015 04:17   I have personally reviewed and evaluated these images and lab results as part of my medical decision-making.   EKG Interpretation None      MDM   Final diagnoses:  Contusion of right foot, initial encounter    Patient presents with pain to the right foot after dropping a heavy object on  it. She is nontoxic. She is given naproxen for pain. Plain films are negative. She has evidence of contusion and tenderness over the proximal midfoot. The concern at this time for Lisfranc injury given negative imaging and proximal location of pain. Likely contusion related pain.  Naproxen for pain. Ice at home. Ambulate as tolerated.  After history, exam, and medical workup I feel the patient has been appropriately medically screened and is safe for discharge home. Pertinent diagnoses were discussed with the patient. Patient was given return precautions.     Shon Batonourtney F Horton, MD 11/24/15 [phone]

## 2015-11-24 NOTE — ED Notes (Signed)
Pt c/o rt foot pain after dropping approx. 35lb object on top of foot.

## 2016-09-13 IMAGING — DX DG FOOT COMPLETE 3+V*R*
3 series · 3 of 3 positions shown · non-contrast
Comparison: None.

CLINICAL DATA: Right foot pain after dropping a bed frame on at
tonight. Initial encounter.

EXAM:
RIGHT FOOT COMPLETE - 3+ VIEW

[foot ap]
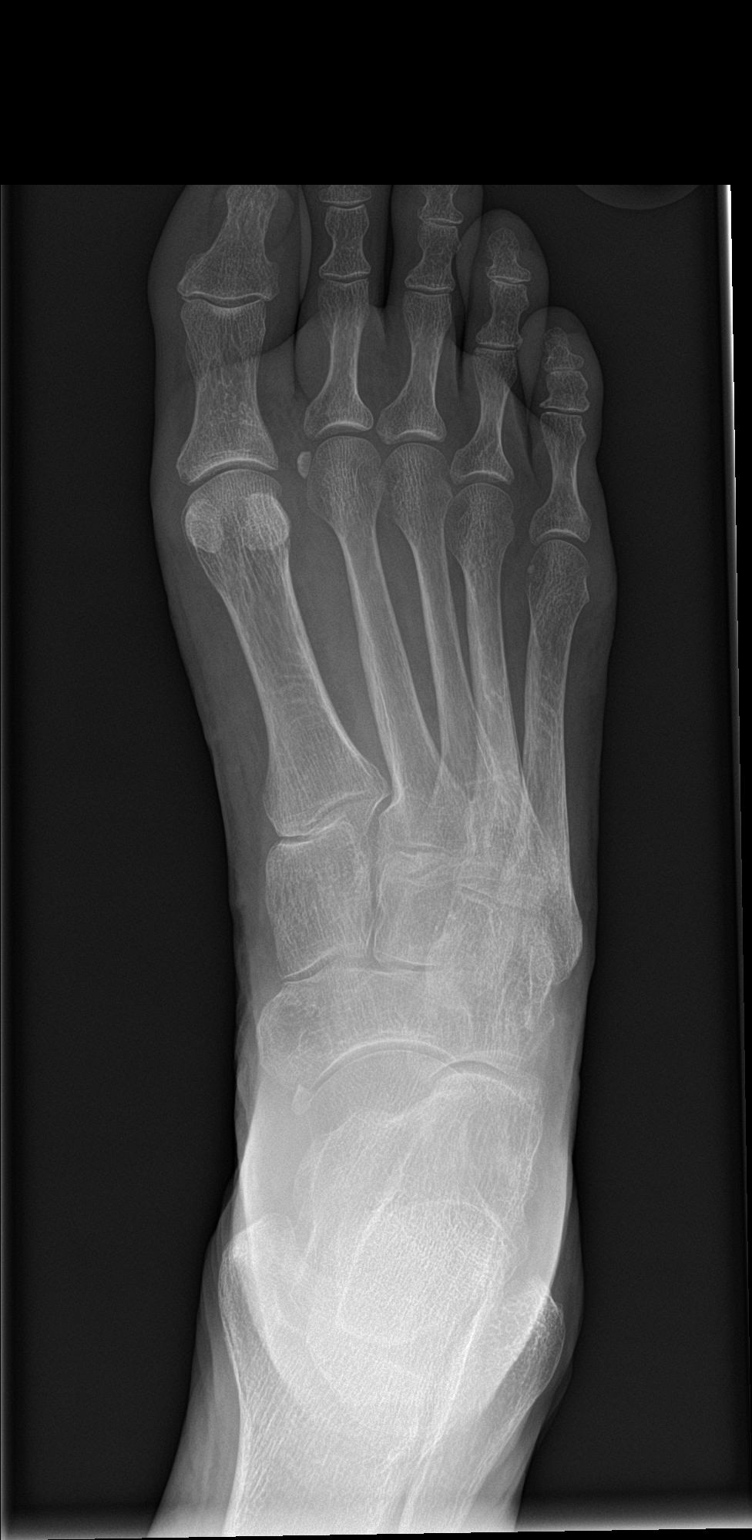

[foot obl]
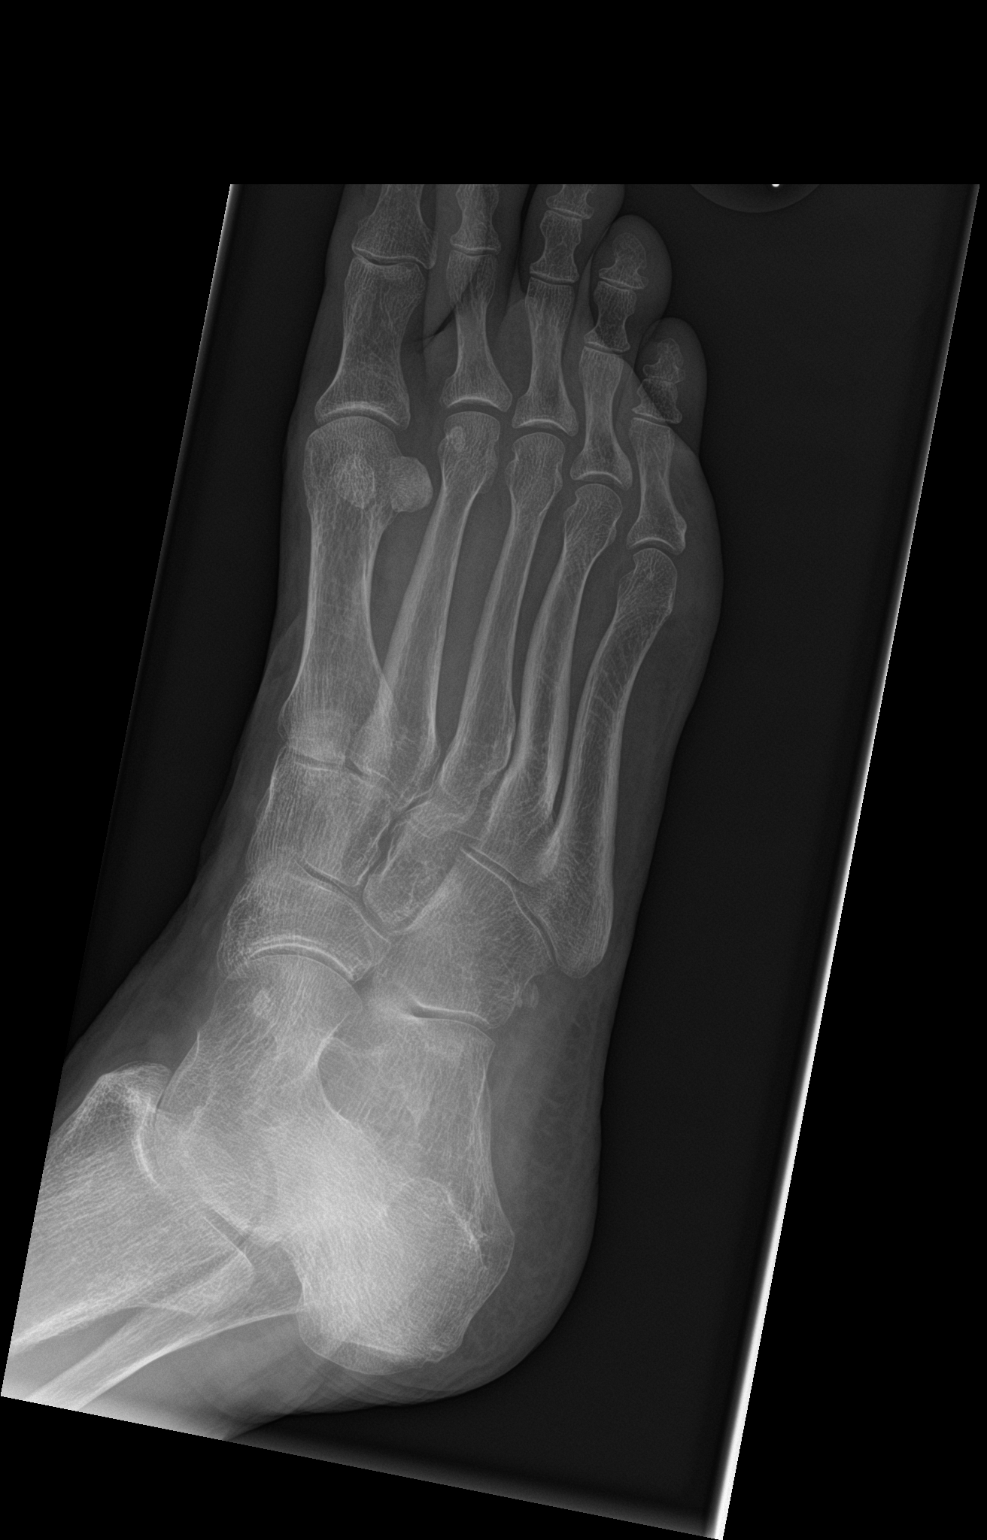

[foot lat]
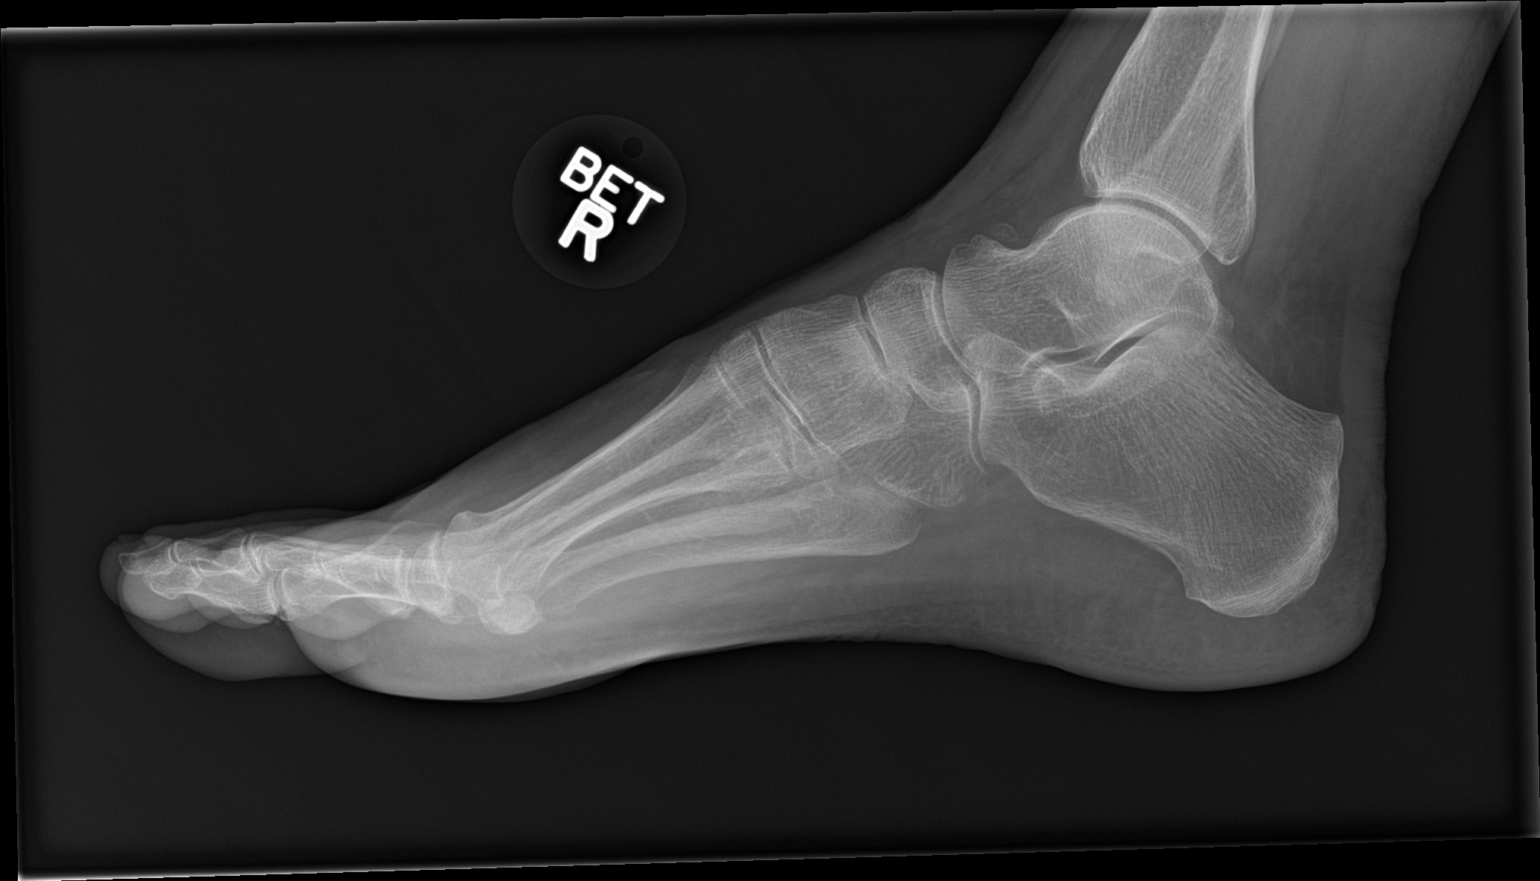

[3 of 3 positions shown; findings below may reference images not displayed]

FINDINGS: There is no evidence of fracture or dislocation. There is no
evidence of arthropathy or other focal bone abnormality. Soft
tissues are unremarkable.
IMPRESSION: Negative.

## 2019-03-28 ENCOUNTER — Other Ambulatory Visit: Payer: Self-pay | Admitting: *Deleted

## 2019-03-28 DIAGNOSIS — Z20822 Contact with and (suspected) exposure to covid-19: Secondary | ICD-10-CM

## 2019-03-30 LAB — NOVEL CORONAVIRUS, NAA: SARS-CoV-2, NAA: NOT DETECTED

## 2022-03-20 ENCOUNTER — Other Ambulatory Visit: Payer: Self-pay

## 2022-03-20 ENCOUNTER — Emergency Department (HOSPITAL_COMMUNITY): Payer: Medicare Other

## 2022-03-20 ENCOUNTER — Inpatient Hospital Stay (HOSPITAL_COMMUNITY)
Admission: EM | Admit: 2022-03-20 | Discharge: 2022-03-30 | DRG: 907 | Disposition: A | Discharge status: Skilled Nursing Facility | Payer: Medicare Other | Attending: Internal Medicine | Admitting: Internal Medicine

## 2022-03-20 ENCOUNTER — Inpatient Hospital Stay (HOSPITAL_COMMUNITY): Payer: Medicare Other

## 2022-03-20 DIAGNOSIS — F17210 Nicotine dependence, cigarettes, uncomplicated: Secondary | ICD-10-CM | POA: Diagnosis present

## 2022-03-20 DIAGNOSIS — R0602 Shortness of breath: Secondary | ICD-10-CM | POA: Diagnosis not present

## 2022-03-20 DIAGNOSIS — R4182 Altered mental status, unspecified: Secondary | ICD-10-CM | POA: Diagnosis present

## 2022-03-20 DIAGNOSIS — Z20822 Contact with and (suspected) exposure to covid-19: Secondary | ICD-10-CM | POA: Diagnosis present

## 2022-03-20 DIAGNOSIS — R7401 Elevation of levels of liver transaminase levels: Secondary | ICD-10-CM

## 2022-03-20 DIAGNOSIS — E559 Vitamin D deficiency, unspecified: Secondary | ICD-10-CM | POA: Diagnosis present

## 2022-03-20 DIAGNOSIS — W19XXXA Unspecified fall, initial encounter: Secondary | ICD-10-CM

## 2022-03-20 DIAGNOSIS — G9341 Metabolic encephalopathy: Secondary | ICD-10-CM

## 2022-03-20 DIAGNOSIS — Y92009 Unspecified place in unspecified non-institutional (private) residence as the place of occurrence of the external cause: Secondary | ICD-10-CM | POA: Diagnosis not present

## 2022-03-20 DIAGNOSIS — R627 Adult failure to thrive: Secondary | ICD-10-CM | POA: Diagnosis present

## 2022-03-20 DIAGNOSIS — S82209A Unspecified fracture of shaft of unspecified tibia, initial encounter for closed fracture: Secondary | ICD-10-CM

## 2022-03-20 DIAGNOSIS — E43 Unspecified severe protein-calorie malnutrition: Secondary | ICD-10-CM | POA: Diagnosis present

## 2022-03-20 DIAGNOSIS — N179 Acute kidney failure, unspecified: Secondary | ICD-10-CM | POA: Diagnosis present

## 2022-03-20 DIAGNOSIS — J439 Emphysema, unspecified: Secondary | ICD-10-CM | POA: Diagnosis present

## 2022-03-20 DIAGNOSIS — G8929 Other chronic pain: Secondary | ICD-10-CM | POA: Diagnosis present

## 2022-03-20 DIAGNOSIS — J9602 Acute respiratory failure with hypercapnia: Secondary | ICD-10-CM | POA: Diagnosis present

## 2022-03-20 DIAGNOSIS — Z681 Body mass index (BMI) 19 or less, adult: Secondary | ICD-10-CM

## 2022-03-20 DIAGNOSIS — Z28310 Unvaccinated for covid-19: Secondary | ICD-10-CM

## 2022-03-20 DIAGNOSIS — S82831A Other fracture of upper and lower end of right fibula, initial encounter for closed fracture: Secondary | ICD-10-CM | POA: Diagnosis not present

## 2022-03-20 DIAGNOSIS — J9601 Acute respiratory failure with hypoxia: Secondary | ICD-10-CM | POA: Diagnosis present

## 2022-03-20 DIAGNOSIS — R188 Other ascites: Secondary | ICD-10-CM

## 2022-03-20 DIAGNOSIS — S82409A Unspecified fracture of shaft of unspecified fibula, initial encounter for closed fracture: Secondary | ICD-10-CM

## 2022-03-20 DIAGNOSIS — T424X1A Poisoning by benzodiazepines, accidental (unintentional), initial encounter: Principal | ICD-10-CM | POA: Diagnosis present

## 2022-03-20 DIAGNOSIS — S2232XA Fracture of one rib, left side, initial encounter for closed fracture: Secondary | ICD-10-CM

## 2022-03-20 DIAGNOSIS — G928 Other toxic encephalopathy: Secondary | ICD-10-CM | POA: Diagnosis present

## 2022-03-20 DIAGNOSIS — I2489 Other forms of acute ischemic heart disease: Secondary | ICD-10-CM | POA: Diagnosis present

## 2022-03-20 DIAGNOSIS — S82231A Displaced oblique fracture of shaft of right tibia, initial encounter for closed fracture: Secondary | ICD-10-CM | POA: Diagnosis present

## 2022-03-20 DIAGNOSIS — E8770 Fluid overload, unspecified: Secondary | ICD-10-CM | POA: Diagnosis present

## 2022-03-20 DIAGNOSIS — S82491A Other fracture of shaft of right fibula, initial encounter for closed fracture: Secondary | ICD-10-CM | POA: Diagnosis present

## 2022-03-20 DIAGNOSIS — G9340 Encephalopathy, unspecified: Secondary | ICD-10-CM

## 2022-03-20 DIAGNOSIS — F1110 Opioid abuse, uncomplicated: Secondary | ICD-10-CM | POA: Diagnosis present

## 2022-03-20 DIAGNOSIS — W1830XA Fall on same level, unspecified, initial encounter: Secondary | ICD-10-CM | POA: Diagnosis present

## 2022-03-20 DIAGNOSIS — S2242XA Multiple fractures of ribs, left side, initial encounter for closed fracture: Secondary | ICD-10-CM | POA: Diagnosis present

## 2022-03-20 DIAGNOSIS — R7989 Other specified abnormal findings of blood chemistry: Secondary | ICD-10-CM

## 2022-03-20 DIAGNOSIS — R0603 Acute respiratory distress: Secondary | ICD-10-CM | POA: Diagnosis not present

## 2022-03-20 DIAGNOSIS — F419 Anxiety disorder, unspecified: Secondary | ICD-10-CM | POA: Diagnosis present

## 2022-03-20 DIAGNOSIS — F17200 Nicotine dependence, unspecified, uncomplicated: Secondary | ICD-10-CM

## 2022-03-20 DIAGNOSIS — S82401A Unspecified fracture of shaft of right fibula, initial encounter for closed fracture: Secondary | ICD-10-CM

## 2022-03-20 DIAGNOSIS — S82201A Unspecified fracture of shaft of right tibia, initial encounter for closed fracture: Secondary | ICD-10-CM

## 2022-03-20 LAB — BASIC METABOLIC PANEL
Anion gap: 6 (ref 5–15)
BUN: 27 mg/dL — ABNORMAL HIGH (ref 8–23)
CO2: 34 mmol/L — ABNORMAL HIGH (ref 22–32)
Calcium: 8.4 mg/dL — ABNORMAL LOW (ref 8.9–10.3)
Chloride: 101 mmol/L (ref 98–111)
Creatinine, Ser: 1.09 mg/dL — ABNORMAL HIGH (ref 0.44–1.00)
GFR, Estimated: 56 mL/min — ABNORMAL LOW (ref >60)
Glucose, Bld: 116 mg/dL — ABNORMAL HIGH (ref 70–99)
Potassium: 4 mmol/L (ref 3.5–5.1)
Sodium: 141 mmol/L (ref 135–145)

## 2022-03-20 LAB — COMPREHENSIVE METABOLIC PANEL
ALT: 211 U/L — ABNORMAL HIGH (ref 0–44)
AST: 399 U/L — ABNORMAL HIGH (ref 15–41)
Albumin: 3.2 g/dL — ABNORMAL LOW (ref 3.5–5.0)
Alkaline Phosphatase: 84 U/L (ref 38–126)
Anion gap: 7 (ref 5–15)
BUN: 27 mg/dL — ABNORMAL HIGH (ref 8–23)
CO2: 34 mmol/L — ABNORMAL HIGH (ref 22–32)
Calcium: 8.6 mg/dL — ABNORMAL LOW (ref 8.9–10.3)
Chloride: 100 mmol/L (ref 98–111)
Creatinine, Ser: 1.17 mg/dL — ABNORMAL HIGH (ref 0.44–1.00)
GFR, Estimated: 51 mL/min — ABNORMAL LOW (ref >60)
Glucose, Bld: 109 mg/dL — ABNORMAL HIGH (ref 70–99)
Potassium: 4 mmol/L (ref 3.5–5.1)
Sodium: 141 mmol/L (ref 135–145)
Total Bilirubin: 1.1 mg/dL (ref 0.3–1.2)
Total Protein: 5.9 g/dL — ABNORMAL LOW (ref 6.5–8.1)

## 2022-03-20 LAB — TROPONIN I (HIGH SENSITIVITY)
Troponin I (High Sensitivity): 240 ng/L (ref <18)
Troponin I (High Sensitivity): 208 ng/L (ref <18)

## 2022-03-20 LAB — CBC
HCT: 39.3 % (ref 36.0–46.0)
Hemoglobin: 12.5 g/dL (ref 12.0–15.0)
MCH: 34.6 pg — ABNORMAL HIGH (ref 26.0–34.0)
MCHC: 31.8 g/dL (ref 30.0–36.0)
MCV: 108.9 fL — ABNORMAL HIGH (ref 80.0–100.0)
Platelets: 114 10*3/uL — ABNORMAL LOW (ref 150–400)
RBC: 3.61 MIL/uL — ABNORMAL LOW (ref 3.87–5.11)
RDW: 15.2 % (ref 11.5–15.5)
WBC: 6.9 10*3/uL (ref 4.0–10.5)
nRBC: 0.4 % — ABNORMAL HIGH (ref 0.0–0.2)

## 2022-03-20 LAB — RESP PANEL BY RT-PCR (FLU A&B, COVID) ARPGX2
Influenza A by PCR: NEGATIVE
Influenza B by PCR: NEGATIVE
SARS Coronavirus 2 by RT PCR: NEGATIVE

## 2022-03-20 LAB — BLOOD GAS, VENOUS
Acid-Base Excess: 12 mmol/L — ABNORMAL HIGH (ref 0.0–2.0)
Bicarbonate: 40.7 mmol/L — ABNORMAL HIGH (ref 20.0–28.0)
Drawn by: 36176
O2 Saturation: 14.3 %
Patient temperature: 37.1
pCO2, Ven: 72 mmHg (ref 44–60)
pH, Ven: 7.36 (ref 7.25–7.43)
pO2, Ven: <31 mmHg — CL (ref 32–45)

## 2022-03-20 LAB — ETHANOL: Alcohol, Ethyl (B): <10 mg/dL (ref <10)

## 2022-03-20 LAB — PROTIME-INR
INR: 1.7 — ABNORMAL HIGH (ref 0.8–1.2)
Prothrombin Time: 19.7 seconds — ABNORMAL HIGH (ref 11.4–15.2)

## 2022-03-20 LAB — SALICYLATE LEVEL: Salicylate Lvl: <7 mg/dL — ABNORMAL LOW (ref 7.0–30.0)

## 2022-03-20 LAB — CK: Total CK: 149 U/L (ref 38–234)

## 2022-03-20 LAB — AMMONIA: Ammonia: 12 umol/L (ref 9–35)

## 2022-03-20 LAB — LACTIC ACID, PLASMA: Lactic Acid, Venous: 1.5 mmol/L (ref 0.5–1.9)

## 2022-03-20 LAB — ACETAMINOPHEN LEVEL: Acetaminophen (Tylenol), Serum: <10 ug/mL — ABNORMAL LOW (ref 10–30)

## 2022-03-20 LAB — LIPASE, BLOOD: Lipase: 43 U/L (ref 11–51)

## 2022-03-20 MED ORDER — OXYCODONE HCL 5 MG PO TABS
5.0000 mg | ORAL_TABLET | ORAL | Status: DC | PRN
  Administered 2022-03-23 – 2022-03-30 (×16): 5 mg via ORAL
  Filled 2022-03-20 (×16): qty 1

## 2022-03-20 MED ORDER — ONDANSETRON HCL 4 MG/2ML IJ SOLN: 4.0000 mg | Freq: Four times a day (QID) | INTRAMUSCULAR | Status: DC | PRN

## 2022-03-20 MED ORDER — IOHEXOL 300 MG/ML  SOLN
100.0000 mL | Freq: Once | INTRAMUSCULAR | Status: AC | PRN
  Administered 2022-03-20: 100 mL via INTRAVENOUS

## 2022-03-20 MED ORDER — FENTANYL CITRATE PF 50 MCG/ML IJ SOSY
50.0000 ug | PREFILLED_SYRINGE | Freq: Once | INTRAMUSCULAR | Status: AC
  Administered 2022-03-20: 50 ug via INTRAVENOUS
  Filled 2022-03-20: qty 1

## 2022-03-20 MED ORDER — ACETAMINOPHEN 325 MG PO TABS
650.0000 mg | ORAL_TABLET | Freq: Four times a day (QID) | ORAL | Status: DC | PRN
  Administered 2022-03-21 – 2022-03-22 (×2): 650 mg via ORAL
  Filled 2022-03-20 (×2): qty 2

## 2022-03-20 MED ORDER — ONDANSETRON HCL 4 MG PO TABS: 4.0000 mg | ORAL_TABLET | Freq: Four times a day (QID) | ORAL | Status: DC | PRN

## 2022-03-20 MED ORDER — SODIUM CHLORIDE 0.9 % IV BOLUS
1000.0000 mL | Freq: Once | INTRAVENOUS | Status: AC
  Administered 2022-03-20: 1000 mL via INTRAVENOUS

## 2022-03-20 MED ORDER — HEPARIN SODIUM (PORCINE) 5000 UNIT/ML IJ SOLN
5000.0000 [IU] | Freq: Three times a day (TID) | INTRAMUSCULAR | Status: DC
  Administered 2022-03-21 – 2022-03-23 (×7): 5000 [IU] via SUBCUTANEOUS
  Filled 2022-03-20 (×7): qty 1

## 2022-03-20 MED ORDER — ONDANSETRON HCL 4 MG/2ML IJ SOLN
4.0000 mg | Freq: Once | INTRAMUSCULAR | Status: AC
  Administered 2022-03-20: 4 mg via INTRAVENOUS
  Filled 2022-03-20: qty 2

## 2022-03-20 MED ORDER — NICOTINE 21 MG/24HR TD PT24
21.0000 mg | MEDICATED_PATCH | Freq: Every day | TRANSDERMAL | Status: DC
  Administered 2022-03-21 – 2022-03-30 (×10): 21 mg via TRANSDERMAL
  Filled 2022-03-20 (×10): qty 1

## 2022-03-20 MED ORDER — ALBUTEROL SULFATE (2.5 MG/3ML) 0.083% IN NEBU: 2.5000 mg | INHALATION_SOLUTION | RESPIRATORY_TRACT | Status: DC | PRN

## 2022-03-20 MED ORDER — SODIUM CHLORIDE 0.9 % IV SOLN: INTRAVENOUS | Status: DC

## 2022-03-20 MED ORDER — ACETAMINOPHEN 650 MG RE SUPP: 650.0000 mg | Freq: Four times a day (QID) | RECTAL | Status: DC | PRN

## 2022-03-20 NOTE — H&P (Signed)
History and Physical    Patient: Valerie Bradley [id number] DOB: 14-Feb-1956 DOA: 03/20/2022 DOS: the patient was seen and examined on 03/20/2022 PCP: Pcp, No  Patient coming from: Home  Chief Complaint:  Chief Complaint  Patient presents with   Fall   HPI: Valerie Bradley is a 66 y.o. female with medical history significant of opiate abuse, anxiety, arthritis, and heavy smoking, presents to the ED with a chief complaint of fall.  Patient reports that she fell and could not get up.  This she does not remember the circumstances.  She is not sure if she fell and hit her head and passed out, or if she passed out and then fell and hit her head.  She is oriented to self-but uses her maiden name instead of her married name, but not to place or time.  She was found down by family and they report that they found a bottle of Valium next to her that was empty but had been a 90-day supply written on September 5.  Patient reports that she has not been taking any medication.  He also reports that she has a history of opiate abuse.  She was on Suboxone.  They are not sure when she has had her last Suboxone.  At this time patient complains of pain in her right leg where she has 2 fractures.  She does not complain of any pain in her chest wall despite having 5 rib fractures.  She is tolerating the BiPAP well.  Review of systems cannot be performed secondary to patient's confusion.  At baseline she is very independent, takes care of her own ADLs, and can hold a normal conversation.  Son and daughter at bedside.  Patient smokes 2 packs/day.  She does not drink alcohol.  She does not use illicit drugs.  She is not vaccinated for COVID.  She is full code. Review of Systems: unable to review all systems due to the inability of the patient to answer questions. Past Medical History:  Diagnosis Date   Arthritis    Past Surgical History:  Procedure Laterality Date   BACK SURGERY     TUBAL LIGATION     Social  History:  reports that she has been smoking. She has been smoking an average of .5 packs per day. She does not have any smokeless tobacco history on file. She reports current alcohol use. No history on file for drug use.  No Known Allergies  No family history on file.  Prior to Admission medications   Medication Sig Start Date End Date Taking? Authorizing Provider  amoxicillin-clavulanate (AUGMENTIN) 875-125 MG per tablet Take 1 tablet by mouth every 12 (twelve) hours. 03/21/13   Fredia Sorrow, MD  buprenorphine-naloxone (SUBOXONE) 8-2 MG SUBL SL tablet Place 1 tablet under the tongue 2 (two) times daily at 10 AM and 5 PM.    [provider]  diazepam (VALIUM) 10 MG tablet Take 10 mg by mouth at bedtime.    [provider]  HYDROcodone-acetaminophen (NORCO/VICODIN) 5-325 MG per tablet Take 1-2 tablets by mouth every 6 (six) hours as needed for pain. 03/21/13   Fredia Sorrow, MD  naproxen (NAPROSYN) 500 MG tablet Take 1 tablet (500 mg total) by mouth once. 11/24/15   Merryl Hacker, MD    Physical Exam: Vitals:   03/20/22 2130 03/20/22 2204 03/20/22 2208 03/20/22 2215  BP: 116/64 126/88    Pulse: 90  94 79  Resp: 16  20 16   Temp:  TempSrc:      SpO2: 95%  100% 98%   1.  General: Patient lying supine in bed, chronically ill-appearing, head of bed elevated   2. Psychiatric: Alert and oriented x self, mood and behavior normal for situation, pleasant and cooperative with exam   3. Neurologic: Speech and language are normal, face is symmetric, moves all 4 extremities voluntarily, normal sensation in the bilateral feet, at baseline without acute deficits on limited exam   4. HEENMT:  Head is atraumatic, normocephalic, pupils reactive to light, neck is supple, trachea is midline, mucous membranes are moist   5. Respiratory : Crackles present on auscultation without wheezing or rhonchi, no cyanosis, no increased work of breathing, on BiPAP   6.  Cardiovascular : Heart rate normal, rhythm is regular, no murmurs, rubs or gallops, no peripheral edema, peripheral pulses palpated   7. Gastrointestinal:  Abdomen is soft, nondistended, nontender to palpation bowel sounds active, no masses or organomegaly palpated   8. Skin:  Skin is warm, dry and intact without rashes, acute lesions, or ulcers on limited exam   9.Musculoskeletal:  Right lower extremity is splinted, normal sensation in the exposed toes, no calf tenderness on the contralateral side, no other acute deformities  Data Reviewed: In the ED Temp 98.9, heart rate 88-99, respiratory rate 14-24, blood pressure 109/57-137/93 satting at 100% on 3 L Patient was 72% on room air at arrival pH is 7.36, PCO2 72, bicarb 34 Patient has no leukocytosis at 6.9, hemoglobin 12.5, platelets 114 Chemistry reveals an elevated bicarb at 34, elevated BUN at 27 slightly elevated creatinine at 1.09 which is likely her baseline CPK is unremarkable at 149, lactic acid is 1.5 Tylenol less than 10, salicylate is less than 7 Negative COVID and flu Chest x-ray shows left-sided rib fractures X-ray pelvis shows no definitive displaced fracture x-ray tib-fib of the right leg shows minimally displaced oblique fracture of the distal tibia and minimally displaced fracture of the fibula CT head and C-spine shows no acute intracranial changes.  No acute displaced fracture or traumatic listhesis of the C-spine CT abdomen pelvis shows left ribs 7 through 12 fractured, bilateral pleural effusions and associated atelectasis, and pulmonary edema  At admission troponin ordered and is elevated at 240 Admission requested for acute tori failure with hypoxia and hypercapnia  Assessment and Plan: * Acute respiratory failure with hypoxia (HCC) - With hypoxia and hypercapnia  -Possible COPD exacerbation although no documented history - Patient does have a history of smoking - PCO2 is elevated at 72 - Suspected volume  overdose would also reduce respiratory drive and cause the same elevation PCO2 -patient was satting at 70s on room air at arrival - She was requiring 3 L nasal cannula to normalize, but patient has been placed on BiPAP to normalize PCO2 and also because of pulmonary edema - Pulmonary edema does not seem to be related to heart failure as she has no peripheral edema, but will check an echo for completeness - EKG shows a heart rate of 100, sinus tachycardia, QTc 09/27/1963, baseline artifact, but no overt ST elevation -Wean off O2 as tolerated - Continue as needed albuterol - Continue to monitor  Elevated troponin - Troponin is currently 240 - This could be secondary to demand ischemia versus cardiac etiology causing her fall - EKG is without acute ischemic changes - Trend troponin, if continuing to increase we will start heparin drip - Monitor on telemetry - Patient is not able to tell us if she  was having any chest pain before, but she is not having any now  Tobacco use disorder - Patient smokes 2 packs a day - When her mentation is clear she will need counseled on cessation  Fall at home, initial encounter - Unknown etiology as patient is unable to provide history - Patient found down - 5 rib fractures, and tib-fib fracture - Trauma team to be notified when patient arrives at [place] team consulted by the ED recommended medicine admission - PT eval and treat - Continue to monitor  Anxiety - Holding Valium in the setting of possible Valium overdose - UDS pending  Fibula fracture - Secondary to fall of unknown mechanism -Splint done in ED - Continue neurovascular checks  Closed tibia fracture - Secondary to fall of unknown mechanism - Splint done in ED - Ortho consulted by ER and has no recommendations aside from neurovascular checks to monitor for compartment syndrome  Traumatic closed displaced fracture of rib on left side - 5 rib fractures on the left side - Trauma  team consulted by ED - Admitted by medicine, trauma team to consult when patient arrives - Care order to notify trauma team when patient arrives at [facility]  Acute metabolic encephalopathy - Patient found down and lethargic - More alert now - CPK 149 - Suspect valium overdose versus COPD exacerbation - UDS pending - Tylenol and salicylate level were undetectable - CT head showed no acute intracranial abnormality - Ammonia level is normal - PCO2 is elevated at 72 - Patient also has pulmonary edema on CT scan - Continue BiPAP - No signs of infection thus far, UA pending - Anticipate further improvement as valium washes out and PCO2 normalizes - Continue to monitor      Advance Care Planning:   Code Status: Full Code   Consults: Trauma team, TOC  Family Communication:  son and daughter at bedside  Severity of Illness: The appropriate patient status for this patient is INPATIENT. Inpatient status is judged to be reasonable and necessary in order to provide the required intensity of service to ensure the patient's safety. The patient's presenting symptoms, physical exam findings, and initial radiographic and laboratory data in the context of their chronic comorbidities is felt to place them at high risk for further clinical deterioration. Furthermore, it is not anticipated that the patient will be medically stable for discharge from the hospital within 2 midnights of admission.   * I certify that at the point of admission it is my clinical judgment that the patient will require inpatient hospital care spanning beyond 2 midnights from the point of admission due to high intensity of service, high risk for further deterioration and high frequency of surveillance required.*  Author: Rolla Plate, DO 03/20/2022 10:34 PM  For on call review [email or web address].

## 2022-03-20 NOTE — ED Triage Notes (Signed)
Pt BIB [facility] for a fall, pt not able to straighten right leg, rotated out.  Unknown how long ago pt fell, family found pt on floor. Reported pt lives alone and altered on EMS arrival, noted her bottle of Valium 5mg  for 90 pills that was filled on 9/5 and was empty, cbg 190. Reported pt's RA sats in the 70's

## 2022-03-20 NOTE — Assessment & Plan Note (Signed)
-   Troponin is currently 240 - This could be secondary to demand ischemia versus cardiac etiology causing her fall - EKG is without acute ischemic changes - Trend troponin, if continuing to increase we will start heparin drip - Monitor on telemetry - Patient is not able to tell us if she was having any chest pain before, but she is not having any now

## 2022-03-20 NOTE — ED Notes (Signed)
Bed alarm applied to bed for pt safety

## 2022-03-20 NOTE — Assessment & Plan Note (Signed)
-   Patient smokes 2 packs a day - When her mentation is clear she will need counseled on cessation

## 2022-03-20 NOTE — Assessment & Plan Note (Signed)
-   5 rib fractures on the left side - Trauma team consulted by ED - Admitted by medicine, trauma team to consult when patient arrives - Care order to notify trauma team when patient arrives at [facility]

## 2022-03-20 NOTE — Assessment & Plan Note (Signed)
-   Unknown etiology as patient is unable to provide history - Patient found down - 5 rib fractures, and tib-fib fracture - Trauma team to be notified when patient arrives at [place] team consulted by the ED recommended medicine admission - PT eval and treat - Continue to monitor

## 2022-03-20 NOTE — ED Notes (Signed)
Family in room  

## 2022-03-20 NOTE — Assessment & Plan Note (Addendum)
-   Patient found down and lethargic - More alert now - CPK 149 - Suspect valium overdose versus COPD exacerbation - UDS pending - Tylenol and salicylate level were undetectable - CT head showed no acute intracranial abnormality - Ammonia level is normal - PCO2 is elevated at 72 - Patient also has pulmonary edema on CT scan - Continue BiPAP - No signs of infection thus far, UA pending - Anticipate further improvement as valium washes out and PCO2 normalizes - Continue to monitor

## 2022-03-20 NOTE — ED Provider Notes (Signed)
[facility] Provider Note   CSN: [id number] Arrival date & time: 03/20/22  1441     History {Add pertinent medical, surgical, social history, OB history to HPI:1} Chief Complaint  Patient presents with   Valerie BangFall    Valerie Bradley is a 66 y.o. female brought in by EMS with altered mental status and hypoxia.  EMS reports that they are unsure how long the patient has been down.  She was found by family on the floor with deformity of the lower extremity.  EMS reports that her oxygen saturations were in the 70s on room air.  They found a bottle of Valium which appeared to be empty written as a 90-day supply on September 5.  No other information known at this time.  Patient confused and unable to contribute to history.   Fall       Home Medications Prior to Admission medications   Medication Sig Start Date End Date Taking? Authorizing Provider  amoxicillin-clavulanate (AUGMENTIN) 875-125 MG per tablet Take 1 tablet by mouth every 12 (twelve) hours. 03/21/13   Vanetta MuldersZackowski, Sadowsky, MD  buprenorphine-naloxone (SUBOXONE) 8-2 MG SUBL SL tablet Place 1 tablet under the tongue 2 (two) times daily at 10 AM and 5 PM.    [provider]  diazepam (VALIUM) 10 MG tablet Take 10 mg by mouth at bedtime.    [provider]  HYDROcodone-acetaminophen (NORCO/VICODIN) 5-325 MG per tablet Take 1-2 tablets by mouth every 6 (six) hours as needed for pain. 03/21/13   Vanetta MuldersZackowski, Cornell, MD  naproxen (NAPROSYN) 500 MG tablet Take 1 tablet (500 mg total) by mouth once. 11/24/15   Horton, Mayer Maskerourtney F, MD      Allergies    Patient has no known allergies.    Review of Systems   Review of Systems  Physical Exam Updated Vital Signs BP (!) 123/44   Pulse 92   Temp 98.9 F (37.2 C) (Oral)   Resp (!) 21   SpO2 96%  Physical Exam Vitals and nursing note reviewed.  Constitutional:      Comments: Very thin female appearing older than stated age  HENT:     Head: Normocephalic.      Comments: Bruising to the right cheek    Mouth/Throat:     Mouth: Mucous membranes are dry.  Eyes:     Extraocular Movements: Extraocular movements intact.     Pupils: Pupils are equal, round, and reactive to light.     Comments: Pupils mid, reactive, extraocular movements intact  Neck:     Comments: Placed in cervical collar Cardiovascular:     Rate and Rhythm: Normal rate.  Pulmonary:     Effort: Pulmonary effort is normal.     Breath sounds: Normal breath sounds.  Abdominal:     General: There is no distension.     Comments: Scaphoid abdomen, no obvious bruising  Musculoskeletal:     Comments: Moves upper extremities without ataxia, normal strength, no obvious deformities.  Hips are stable to pressure.  Appear well aligned.  Right lower extremity with deformity below the knee.  The right foot is sitting externally rotated.  DP and PT pulse palpable in the foot.  No tenderness to palpation of the femur on the right side.  Patient moves left lower extremity without pain or ataxia, no obvious bruising. No midline spine or tenderness on examination     ED Results / Procedures / Treatments   Labs (all labs ordered are listed, but only  abnormal results are displayed) Labs Reviewed  COMPREHENSIVE METABOLIC PANEL - Abnormal; Notable for the following components:      Result Value   CO2 34 (*)    Glucose, Bld 109 (*)    BUN 27 (*)    Creatinine, Ser 1.17 (*)    Calcium 8.6 (*)    Total Protein 5.9 (*)    Albumin 3.2 (*)    AST 399 (*)    ALT 211 (*)    GFR, Estimated 51 (*)    All other components within normal limits  CBC - Abnormal; Notable for the following components:   RBC 3.61 (*)    MCV 108.9 (*)    MCH 34.6 (*)    Platelets 114 (*)    nRBC 0.4 (*)    All other components within normal limits  PROTIME-INR - Abnormal; Notable for the following components:   Prothrombin Time 19.7 (*)    INR 1.7 (*)    All other components within normal limits  ACETAMINOPHEN LEVEL -  Abnormal; Notable for the following components:   Acetaminophen (Tylenol), Serum <10 (*)    All other components within normal limits  SALICYLATE LEVEL - Abnormal; Notable for the following components:   Salicylate Lvl <7.0 (*)    All other components within normal limits  BLOOD GAS, VENOUS - Abnormal; Notable for the following components:   pCO2, Ven 72 (*)    pO2, Ven <31 (*)    Bicarbonate 40.7 (*)    Acid-Base Excess 12.0 (*)    All other components within normal limits  BASIC METABOLIC PANEL - Abnormal; Notable for the following components:   CO2 34 (*)    Glucose, Bld 116 (*)    BUN 27 (*)    Creatinine, Ser 1.09 (*)    Calcium 8.4 (*)    GFR, Estimated 56 (*)    All other components within normal limits  RESP PANEL BY RT-PCR (FLU A&B, COVID) ARPGX2  LACTIC ACID, PLASMA  CK  AMMONIA  ETHANOL  LIPASE, BLOOD  URINALYSIS, ROUTINE W REFLEX MICROSCOPIC  RAPID URINE DRUG SCREEN, HOSP PERFORMED  HIV ANTIBODY (ROUTINE TESTING W REFLEX)  COMPREHENSIVE METABOLIC PANEL  MAGNESIUM  CBC WITH DIFFERENTIAL/PLATELET  TSH  BLOOD GAS, ARTERIAL  SAMPLE TO BLOOD BANK  TROPONIN I (HIGH SENSITIVITY)    EKG EKG Interpretation  Date/Time:  Sunday March 20 2022 17:14:00 EDT Ventricular Rate:  100 PR Interval:  132 QRS Duration: 90 QT Interval:  360 QTC Calculation: 465 R Axis:   94 Text Interpretation: Sinus tachycardia Nonspecific T wave abnormality Confirmed by Cathren Laine ([id number]) on 03/20/2022 6:36:41 PM  Radiology CT CHEST ABDOMEN PELVIS W CONTRAST  Result Date: 03/20/2022 CLINICAL DATA:  Fall, found on floor EXAM: CT CHEST, ABDOMEN, AND PELVIS WITH CONTRAST TECHNIQUE: Multidetector CT imaging of the chest, abdomen and pelvis was performed following the standard protocol during bolus administration of intravenous contrast. RADIATION DOSE REDUCTION: This exam was performed according to the departmental dose-optimization program which includes automated exposure control,  adjustment of the mA and/or kV according to patient size and/or use of iterative reconstruction technique. CONTRAST:  OMNIPAQUE IOHEXOL 300 MG/ML  SOLN COMPARISON:  CT abdomen pelvis, 03/18/2010 FINDINGS: CT CHEST FINDINGS Cardiovascular: Aortic atherosclerosis. Normal heart size. No pericardial effusion. Mediastinum/Nodes: No enlarged mediastinal, hilar, or axillary lymph nodes. Thyroid gland, trachea, and esophagus demonstrate no significant findings. Lungs/Pleura: Severe emphysema. Mild, diffuse bilateral bronchial wall thickening. Interlobular septal thickening. Small bilateral pleural effusions and associated  atelectasis or consolidation. Musculoskeletal: No chest wall abnormality. Mildly displaced, acute fractures of the lateral and posterior left seventh through twelfth ribs. CT ABDOMEN PELVIS FINDINGS Hepatobiliary: No solid liver abnormality is seen. No gallstones, gallbladder wall thickening, or biliary dilatation. Pancreas: Unremarkable. No pancreatic ductal dilatation or surrounding inflammatory changes. Spleen: Normal in size without significant abnormality. Adrenals/Urinary Tract: Adrenal glands are unremarkable. 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 The left kidney is normal, without renal calculi, solid lesion, or hydronephrosis. Bladder is unremarkable. Stomach/Bowel: Stomach is within normal limits. Appendix appears normal. No evidence of bowel wall thickening, distention, or inflammatory changes. Vascular/Lymphatic: Aortic atherosclerosis. No enlarged abdominal or pelvic lymph nodes. Reproductive: No mass or other abnormality. Other: No abdominal wall hernia or abnormality. Small volume perihepatic ascites. Musculoskeletal: No acute osseous findings. IMPRESSION: 1. Mildly displaced, acute fractures of the lateral and posterior left seventh through twelfth ribs. 2. Small bilateral pleural effusions and associated atelectasis or consolidation. No associated  pneumothorax. 3. Diffuse bilateral bronchial wall thickening and interlobular septal thickening, most consistent with pulmonary edema. 4. Severe emphysema. 5. Small volume simple fluid attenuation perihepatic ascites. No direct CT evidence of abdominal or pelvic organ injury. 6. Solitary left kidney. Aortic Atherosclerosis (ICD10-I70.0) and Emphysema (ICD10-J43.9). Electronically Signed   By: Jearld Lesch M.D.   On: 03/20/2022 17:32   CT HEAD WO CONTRAST  Result Date: 03/20/2022 CLINICAL DATA:  Head trauma, moderate-severe; Polytrauma, blunt. Fall EXAM: CT HEAD WITHOUT CONTRAST CT CERVICAL SPINE WITHOUT CONTRAST TECHNIQUE: Multidetector CT imaging of the head and cervical spine was performed following the standard protocol without intravenous contrast. Multiplanar CT image reconstructions of the cervical spine were also generated. RADIATION DOSE REDUCTION: This exam was performed according to the departmental dose-optimization program which includes automated exposure control, adjustment of the mA and/or kV according to patient size and/or use of iterative reconstruction technique. COMPARISON:  None Available. FINDINGS: CT HEAD FINDINGS Brain: No evidence of large-territorial acute infarction. No parenchymal hemorrhage. No mass lesion. No extra-axial collection. No mass effect or midline shift. No hydrocephalus. Basilar cisterns are patent. Vascular: No hyperdense vessel. Atherosclerotic calcifications are present within the cavernous internal carotid arteries. Skull: No acute fracture or focal lesion. Sinuses/Orbits: Paranasal sinuses and mastoid air cells are clear. Bilateral lens replacement. Otherwise the orbits are unremarkable. Other: None. CT CERVICAL SPINE FINDINGS Alignment: Normal. Skull base and vertebrae: No acute fracture. No aggressive appearing focal osseous lesion or focal pathologic process. Soft tissues and spinal canal: No prevertebral fluid or swelling. No visible canal hematoma. Upper  chest: Emphysematous changes. Interlobular septal wall thickening. Other: None. IMPRESSION: 1. No acute intracranial abnormality. 2. No acute displaced fracture or traumatic listhesis of the cervical spine. 3.  Emphysema (ICD10-J43.9). 4. Possible pulmonary edema. Please see separately dictated CT chest 03/20/2022. Electronically Signed   By: Tish Frederickson M.D.   On: 03/20/2022 17:14   CT CERVICAL SPINE WO CONTRAST  Result Date: 03/20/2022 CLINICAL DATA:  Head trauma, moderate-severe; Polytrauma, blunt. Fall EXAM: CT HEAD WITHOUT CONTRAST CT CERVICAL SPINE WITHOUT CONTRAST TECHNIQUE: Multidetector CT imaging of the head and cervical spine was performed following the standard protocol without intravenous contrast. Multiplanar CT image reconstructions of the cervical spine were also generated. RADIATION DOSE REDUCTION: This exam was performed according to the departmental dose-optimization program which includes automated exposure control, adjustment of the mA and/or kV according to patient size and/or use of iterative reconstruction technique. COMPARISON:  None Available. FINDINGS: CT HEAD FINDINGS Brain: No evidence of large-territorial  acute infarction. No parenchymal hemorrhage. No mass lesion. No extra-axial collection. No mass effect or midline shift. No hydrocephalus. Basilar cisterns are patent. Vascular: No hyperdense vessel. Atherosclerotic calcifications are present within the cavernous internal carotid arteries. Skull: No acute fracture or focal lesion. Sinuses/Orbits: Paranasal sinuses and mastoid air cells are clear. Bilateral lens replacement. Otherwise the orbits are unremarkable. Other: None. CT CERVICAL SPINE FINDINGS Alignment: Normal. Skull base and vertebrae: No acute fracture. No aggressive appearing focal osseous lesion or focal pathologic process. Soft tissues and spinal canal: No prevertebral fluid or swelling. No visible canal hematoma. Upper chest: Emphysematous changes. Interlobular  septal wall thickening. Other: None. IMPRESSION: 1. No acute intracranial abnormality. 2. No acute displaced fracture or traumatic listhesis of the cervical spine. 3.  Emphysema (ICD10-J43.9). 4. Possible pulmonary edema. Please see separately dictated CT chest 03/20/2022. Electronically Signed   By: Tish Frederickson M.D.   On: 03/20/2022 17:14   DG Pelvis Portable  Result Date: 03/20/2022 CLINICAL DATA:  Trauma EXAM: PORTABLE PELVIS 1-2 VIEWS COMPARISON:  March 09, 2010 FINDINGS: Evaluation is limited by technique. No pelvic diastasis. Degenerative changes of bilateral hips. Limited assessment of the sacrum secondary to overlapping bowel contents. No definitive acute displaced fracture is visualized. IMPRESSION: Limited evaluation due to technique. No definitive acute displaced fracture is visualized. If persistent clinical concern, recommend dedicated cross-sectional imaging or additional radiographic views. Electronically Signed   By: Meda Klinefelter M.D.   On: 03/20/2022 16:02   DG Chest Port 1 View  Result Date: 03/20/2022 CLINICAL DATA:  Trauma EXAM: PORTABLE CHEST 1 VIEW COMPARISON:  March 18, 2010 FINDINGS: The cardiomediastinal silhouette is enlarged in contour, increased since 2011.Atherosclerotic calcifications. No pleural effusion. No pneumothorax. Diffuse coarse interstitial opacities. Questionable nodular opacity at the RIGHT apex. There are several age indeterminate LEFT-sided rib fractures of the approximate fifth and sixth ribs. IMPRESSION: 1. Age indeterminate LEFT-sided rib fractures. Recommend correlation with point tenderness. No pneumothorax is identified. 2. Questionable RIGHT apical nodular opacity versus summation artifact. Consider PA and lateral chest radiograph versus dedicated CT scan for improved evaluation. 3. Increased cardiomegaly in comparison to prior from 2011. 4. Diffuse coarse reticulation may reflect a degree of underlying interstitial lung disease or  pulmonary emphysema. Electronically Signed   By: Meda Klinefelter M.D.   On: 03/20/2022 16:01   DG Tibia/Fibula Right Port  Result Date: 03/20/2022 CLINICAL DATA:  Blunt Trauma EXAM: PORTABLE RIGHT TIBIA AND FIBULA - 2 VIEW COMPARISON:  None Available. FINDINGS: Osteopenia. There is an oblique fracture of the distal tibial shaft with minimal lateral displacement of the distal fragment. Nondisplaced component extends inferiorly. There is a minimally displaced fracture of the fibular head. No unexpected radiopaque foreign body. Soft tissue edema. IMPRESSION: Minimally displaced oblique fracture of the distal tibial shaft and a minimally displaced fracture of the fibular head. Electronically Signed   By: Meda Klinefelter M.D.   On: 03/20/2022 15:57    Procedures .Critical Care  Performed by: Arthor Captain, PA-C Authorized by: Arthor Captain, PA-C   Critical care provider statement:    Critical care time (minutes):  75   Critical care time was exclusive of:  Separately billable procedures and treating other patients   Critical care was necessary to treat or prevent imminent or life-threatening deterioration of the following conditions:  Respiratory failure, trauma, hepatic failure and CNS failure or compromise   Critical care was time spent personally by me on the following activities:  Development of treatment plan with patient or surrogate,  discussions with consultants, evaluation of patient's response to treatment, examination of patient, ordering and review of laboratory studies, ordering and review of radiographic studies, ordering and performing treatments and interventions, pulse oximetry, re-evaluation of patient's condition and review of old charts   {Document cardiac monitor, telemetry assessment procedure when appropriate:1}  Medications Ordered in ED Medications  heparin injection 5,000 Units (has no administration in time range)  acetaminophen (TYLENOL) tablet 650 mg (has no  administration in time range)    Or  acetaminophen (TYLENOL) suppository 650 mg (has no administration in time range)  oxyCODONE (Oxy IR/ROXICODONE) immediate release tablet 5 mg (has no administration in time range)  0.9 %  sodium chloride infusion (has no administration in time range)  ondansetron (ZOFRAN) tablet 4 mg (has no administration in time range)    Or  ondansetron (ZOFRAN) injection 4 mg (has no administration in time range)  albuterol (PROVENTIL) (2.5 MG/3ML) 0.083% nebulizer solution 2.5 mg (has no administration in time range)  fentaNYL (SUBLIMAZE) injection 50 mcg (50 mcg Intravenous Given by Other 03/20/22 1459)  ondansetron (ZOFRAN) injection 4 mg (4 mg Intravenous Given by Other 03/20/22 1458)  iohexol (OMNIPAQUE) 300 MG/ML solution 100 mL (100 mLs Intravenous Contrast Given 03/20/22 1703)  sodium chloride 0.9 % bolus 1,000 mL (1,000 mLs Intravenous New Bag/Given 03/20/22 1736)  fentaNYL (SUBLIMAZE) injection 50 mcg (50 mcg Intravenous Given 03/20/22 1956)    ED Course/ Medical Decision Making/ A&P Clinical Course as of 03/20/22 2050  Sun Mar 20, 2022  1536 Patient with distracting lower extremity injury.  He has facial injuries, altered mental status.  She was on no spinal precautions on arrival.  She is on no oxygen on arrival.  Oxygen saturations in the 70s.  She was placed in c-collar.  Patient currently on 4 L via nasal cannula [AH]  1540 I stayed for bedside portable chest and pelvis x-ray.  No obvious signs of pneumothorax.  No evidence of open book pelvic fracture on trauma scan.  She appears to have a tib-fib fracture on the right lower extremity. [AH]  1540 Patient arrives to the emergency department with altered mental status.  Differential diagnosis includes drug overdose, sepsis, head injury, opiate narcosis, hypoxic or hypercarbic narcosis.  Patient placed on 4 L via nasal cannula, placed on spinal precautions.  All of her clothing was removed for observation and  assessment of the patient following ATLS protocol. Patient given 50 mEq of fentanyl and 4 of Zofran. Patient does not have any obvious signs of stroke such as flaccid paralysis or facial droop. Is following some commands. [AH]  1602 DG Chest Port 1 View I personally visualized portable chest x-ray and interpreted films.  She appears to have multiple rib fractures.  Do not see evidence of pneumothorax will defer to radiologic interpretation [AH]  1607 DG Pelvis Portable I personally visualized the portable pelvic x-ray.  I interpreted these films.  I believe the patient has a greater trochanter fracture which appears to be an avulsion.  Again no evidence of open book fracture.  Will defer to radiologic interpretation. [AH]  1607 DG Tibia/Fibula Right Port I visualized and interpreted tib-fib x-ray.  She has a tib-fib fracture.  Compartments are soft.  I do not believe the patient has any evidence of compartment syndrome at this time. [AH]  1641 Patient CO2 79, 02< 21 on vbg- I have ordered bipap  [AH]  1642 Creatinine(!): 1.17 Fluids ordered [AH]  1710 AST(!): 399 [AH]  1710 ALT(!): 211 [  AH]  1920 Creatinine(!): 1.17 [AH]  2048 Case discussed with Dr. Sophronia Simas. Recommends medicine admission and trauma will consult. Case also discussed with Dr. Renaye Rakers who will consult for leg fracture. Patient on Bipap- will be admitted by [facility]- discussed with Dr. Carren Rang [AH]    Clinical Course User Index [AH] Arthor Captain, PA-C                           Medical Decision Making This patient presents to the ED for concern of ams, hypoxic resp failure, trauma, this involves an extensive number of treatment options, and is a complaint that carries with it a high risk of complications and morbidity.  The differential diagnosis includes ***   Co morbidities that complicate the patient evaluation       ***   Additional history obtained:  Additional history obtained from *** External  records from outside source obtained and reviewed including ***   Lab Tests:  I Ordered, and personally interpreted labs.  The pertinent results include:  ***    Imaging Studies ordered:  I ordered imaging studies including *** I independently visualized and interpreted imaging which showed *** I agree with the radiologist interpretation   Cardiac Monitoring:       The patient was maintained on a cardiac monitor.  I personally viewed and interpreted the cardiac monitored which showed an underlying rhythm of: ***   Medicines ordered and prescription drug management:  I ordered medication including ***  for *** Reevaluation of the patient after these medicines showed that the patient {resolved/improved/worsened:23923::"improved"} I have reviewed the patients home medicines and have made adjustments as needed   Test Considered:       ***   Critical Interventions:       ***   Consultations Obtained:  I requested consultation with the ***,  and discussed lab and imaging findings as well as pertinent plan - they recommend: ***   Problem List / ED Course:       ***   Reevaluation:  After the interventions noted above, I reevaluated the patient and found that they have :{resolved/improved/worsened:23923::"improved"}   Social Determinants of Health:       ***   Dispostion:  After consideration of the diagnostic results and the patients response to treatment, I feel that the patent would benefit from ***.    Amount and/or Complexity of Data Reviewed Labs: ordered. Decision-making details documented in ED Course. Radiology: ordered. Decision-making details documented in ED Course. ECG/medicine tests: ordered.  Risk Prescription drug management. Decision regarding hospitalization.   ***  {Document critical care time when appropriate:1} {Document review of labs and clinical decision tools ie heart score, Chads2Vasc2 etc:1}  {Document your  independent review of radiology images, and any outside records:1} {Document your discussion with family members, caretakers, and with consultants:1} {Document social determinants of health affecting pt's care:1} {Document your decision making why or why not admission, treatments were needed:1} Final Clinical Impression(s) / ED Diagnoses Final diagnoses:  Acute respiratory failure with hypoxia and hypercapnia (HCC)  Other ascites  Transaminitis  Closed fracture of multiple ribs of left side, initial encounter  Closed fracture of right tibia and fibula, initial encounter  Encephalopathy    Rx / DC Orders ED Discharge Orders     None

## 2022-03-20 NOTE — ED Notes (Signed)
2155- pt found in floor  by staff after hearing monitor alarms. Pt urinated in the floor and assisted back into bed. Denies any pain. EDP Harris to room to assess patient. Vital signs updated.

## 2022-03-20 NOTE — Assessment & Plan Note (Signed)
-   Holding Valium in the setting of possible Valium overdose - UDS pending

## 2022-03-20 NOTE — Assessment & Plan Note (Addendum)
-   Secondary to fall of unknown mechanism - Splint done in ED - Ortho consulted by ER and has no recommendations aside from neurovascular checks to monitor for compartment syndrome

## 2022-03-20 NOTE — Assessment & Plan Note (Addendum)
-   Secondary to fall of unknown mechanism -Splint done in ED - Continue neurovascular checks

## 2022-03-20 NOTE — Assessment & Plan Note (Addendum)
-   With hypoxia and hypercapnia  -Possible COPD exacerbation although no documented history - Patient does have a history of smoking - PCO2 is elevated at 72 - Suspected volume overdose would also reduce respiratory drive and cause the same elevation PCO2 -patient was satting at 70s on room air at arrival - She was requiring 3 L nasal cannula to normalize, but patient has been placed on BiPAP to normalize PCO2 and also because of pulmonary edema - Pulmonary edema does not seem to be related to heart failure as she has no peripheral edema, but will check an echo for completeness - EKG shows a heart rate of 100, sinus tachycardia, QTc 09/27/1965, baseline artifact, but no overt ST elevation -Wean off O2 as tolerated - Continue as needed albuterol - Continue to monitor

## 2022-03-21 ENCOUNTER — Inpatient Hospital Stay (HOSPITAL_COMMUNITY): Payer: Medicare Other

## 2022-03-21 ENCOUNTER — Other Ambulatory Visit (HOSPITAL_COMMUNITY): Payer: Self-pay | Admitting: *Deleted

## 2022-03-21 DIAGNOSIS — R0602 Shortness of breath: Secondary | ICD-10-CM

## 2022-03-21 DIAGNOSIS — G9341 Metabolic encephalopathy: Secondary | ICD-10-CM | POA: Diagnosis not present

## 2022-03-21 DIAGNOSIS — J9601 Acute respiratory failure with hypoxia: Secondary | ICD-10-CM | POA: Diagnosis not present

## 2022-03-21 DIAGNOSIS — R0603 Acute respiratory distress: Secondary | ICD-10-CM

## 2022-03-21 DIAGNOSIS — S2242XA Multiple fractures of ribs, left side, initial encounter for closed fracture: Secondary | ICD-10-CM | POA: Diagnosis not present

## 2022-03-21 DIAGNOSIS — F419 Anxiety disorder, unspecified: Secondary | ICD-10-CM | POA: Diagnosis not present

## 2022-03-21 LAB — HIV ANTIBODY (ROUTINE TESTING W REFLEX): HIV Screen 4th Generation wRfx: NONREACTIVE

## 2022-03-21 LAB — URINALYSIS, ROUTINE W REFLEX MICROSCOPIC
Bacteria, UA: NONE SEEN
Bilirubin Urine: NEGATIVE
Glucose, UA: NEGATIVE mg/dL
Ketones, ur: 20 mg/dL — AB
Leukocytes,Ua: NEGATIVE
Nitrite: NEGATIVE
Protein, ur: 30 mg/dL — AB
Specific Gravity, Urine: 1.043 — ABNORMAL HIGH (ref 1.005–1.030)
pH: 7 (ref 5.0–8.0)

## 2022-03-21 LAB — TROPONIN I (HIGH SENSITIVITY): Troponin I (High Sensitivity): 263 ng/L (ref <18)

## 2022-03-21 LAB — CBC WITH DIFFERENTIAL/PLATELET
Abs Immature Granulocytes: 0.05 10*3/uL (ref 0.00–0.07)
Basophils Absolute: 0 10*3/uL (ref 0.0–0.1)
Basophils Relative: 0 %
Eosinophils Absolute: 0 10*3/uL (ref 0.0–0.5)
Eosinophils Relative: 0 %
HCT: 38.7 % (ref 36.0–46.0)
Hemoglobin: 12.5 g/dL (ref 12.0–15.0)
Immature Granulocytes: 1 %
Lymphocytes Relative: 13 %
Lymphs Abs: 1.1 10*3/uL (ref 0.7–4.0)
MCH: 34.7 pg — ABNORMAL HIGH (ref 26.0–34.0)
MCHC: 32.3 g/dL (ref 30.0–36.0)
MCV: 107.5 fL — ABNORMAL HIGH (ref 80.0–100.0)
Monocytes Absolute: 0.7 10*3/uL (ref 0.1–1.0)
Monocytes Relative: 8 %
Neutro Abs: 6.7 10*3/uL (ref 1.7–7.7)
Neutrophils Relative %: 78 %
Platelets: 106 10*3/uL — ABNORMAL LOW (ref 150–400)
RBC: 3.6 MIL/uL — ABNORMAL LOW (ref 3.87–5.11)
RDW: 15 % (ref 11.5–15.5)
WBC: 8.5 10*3/uL (ref 4.0–10.5)
nRBC: 0 % (ref 0.0–0.2)

## 2022-03-21 LAB — BLOOD GAS, ARTERIAL
Acid-Base Excess: 9.4 mmol/L — ABNORMAL HIGH (ref 0.0–2.0)
Bicarbonate: 36.1 mmol/L — ABNORMAL HIGH (ref 20.0–28.0)
Drawn by: 38235
FIO2: 40 %
O2 Saturation: 95.2 %
Patient temperature: 37
pCO2 arterial: 57 mmHg — ABNORMAL HIGH (ref 32–48)
pH, Arterial: 7.41 (ref 7.35–7.45)
pO2, Arterial: 74 mmHg — ABNORMAL LOW (ref 83–108)

## 2022-03-21 LAB — ECHOCARDIOGRAM COMPLETE
Area-P 1/2: 5.02 cm2
S' Lateral: 2.2 cm
Single Plane A4C EF: 66.2 %

## 2022-03-21 LAB — COMPREHENSIVE METABOLIC PANEL
ALT: 197 U/L — ABNORMAL HIGH (ref 0–44)
AST: 283 U/L — ABNORMAL HIGH (ref 15–41)
Albumin: 3.1 g/dL — ABNORMAL LOW (ref 3.5–5.0)
Alkaline Phosphatase: 83 U/L (ref 38–126)
Anion gap: 6 (ref 5–15)
BUN: 22 mg/dL (ref 8–23)
CO2: 33 mmol/L — ABNORMAL HIGH (ref 22–32)
Calcium: 8.4 mg/dL — ABNORMAL LOW (ref 8.9–10.3)
Chloride: 99 mmol/L (ref 98–111)
Creatinine, Ser: 0.94 mg/dL (ref 0.44–1.00)
GFR, Estimated: >60 mL/min (ref >60)
Glucose, Bld: 97 mg/dL (ref 70–99)
Potassium: 3.6 mmol/L (ref 3.5–5.1)
Sodium: 138 mmol/L (ref 135–145)
Total Bilirubin: 1.7 mg/dL — ABNORMAL HIGH (ref 0.3–1.2)
Total Protein: 5.7 g/dL — ABNORMAL LOW (ref 6.5–8.1)

## 2022-03-21 LAB — SAMPLE TO BLOOD BANK

## 2022-03-21 LAB — MAGNESIUM: Magnesium: 1.9 mg/dL (ref 1.7–2.4)

## 2022-03-21 LAB — RAPID URINE DRUG SCREEN, HOSP PERFORMED
Amphetamines: NOT DETECTED
Barbiturates: NOT DETECTED
Benzodiazepines: POSITIVE — AB
Cocaine: NOT DETECTED
Opiates: NOT DETECTED
Tetrahydrocannabinol: NOT DETECTED

## 2022-03-21 LAB — TSH: TSH: 2.546 u[IU]/mL (ref 0.350–4.500)

## 2022-03-21 MED ORDER — FUROSEMIDE 10 MG/ML IJ SOLN
20.0000 mg | Freq: Two times a day (BID) | INTRAMUSCULAR | Status: DC
  Administered 2022-03-21 – 2022-03-22 (×3): 20 mg via INTRAVENOUS
  Filled 2022-03-21 (×3): qty 2

## 2022-03-21 NOTE — TOC Progression Note (Signed)
  Transition of Care [facility]) Screening Note   Patient Details  Name: Valerie Bradley Date of Birth: [birth year]   Transition of Care [facility]) CM/SW Contact:    Boneta Lucks, RN Phone Number: 03/21/2022, 11:10 AM    Transition of Care Department [facility]) has reviewed patient and no TOC needs have been identified at this time. We will continue to monitor patient advancement through interdisciplinary progression rounds. If new patient transition needs arise, please place a TOC consult.      Barriers to Discharge: Continued Medical Work up (Waiting on bed at [facility])  Expected Discharge Plan and Services    Transfer to [facility]

## 2022-03-21 NOTE — Progress Notes (Signed)
Trauma Event Note    TRN to bedside to round on new admit/trauma c/s. Pt sleeping upon my arrival to bedside, did not rouse to her name being called. Chest rise and fall noted. Has transitioned off of BiPAP, on 2L [place], VSS.   Last imported Vital Signs BP 116/62 (BP Location: Left Arm)   Pulse 93   Temp 97.9 F (36.6 C) (Oral)   Resp 15   SpO2 100%   Trending CBC Recent Labs    03/20/22 1523 03/21/22 0446  WBC 6.9 8.5  HGB 12.5 12.5  HCT 39.3 38.7  PLT 114* 106*    Trending Coag's Recent Labs    03/20/22 1523  INR 1.7*    Trending BMET Recent Labs    03/20/22 1523 03/20/22 1747 03/21/22 0446  NA 141 141 138  K 4.0 4.0 3.6  CL 100 101 99  CO2 34* 34* 33*  BUN 27* 27* 22  CREATININE 1.17* 1.09* 0.94  GLUCOSE 109* 116* 97      Valerie Bradley Valerie Bradley  Trauma Response RN  Please call TRN at [phone] for further assistance.

## 2022-03-21 NOTE — Progress Notes (Signed)
PROGRESS NOTE    Valerie Bradley  [id number] DOB: Aug 08, 1955 DOA: 03/20/2022 PCP: Pcp, No    Brief Narrative:  66 year old female with a history of anxiety on Valium, was brought to the hospital after a fall.  She was noted to be lethargic when she was found on the floor.  Work-up in the emergency room showed that she did have several rib fractures in the left chest, also noted to have a right tibia/fibula fracture.  She was noted to be hypoxic and hypercapnic and was initially placed on BiPAP.  Further imaging did indicate some volume overload.  EDP discussed case with trauma service as well as orthopedics at [facility].  It was recommended patient be admitted to [facility] where these services can consult on her.   Assessment & Plan:   Principal Problem:   Acute respiratory failure with hypoxia (HCC) Active Problems:   Acute metabolic encephalopathy   Traumatic closed displaced fracture of rib on left side   Closed tibia fracture   Fibula fracture   Anxiety   Fall at home, initial encounter   Tobacco use disorder   Elevated troponin   Acute respiratory failure with hypoxia and hypercapnia -Possibly related to hypoventilation secondary to medications -Also appears that she may have some volume overload which could be contributing -On admission, she was noted to be hypoxic and hypercapnic -Placed on BiPAP -Overall mental status does appear to be improving -We will request respiratory to reevaluate for trial off BiPAP  Pulmonary edema Possible acute diastolic congestive heart failure -She does have evidence of volume overload with lower extremity edema, possible interstitial edema -Echocardiogram has been ordered -We will give a trial of Lasix  Closed right tibia and fibula fracture -ED discussed with orthopedics, Dr. Fredonia Highland -Currently right leg placed in splint -Orthopedics will evaluate on transfer to [facility]  Left-sided rib fractures, 7th-12th ribs -Secondary to  fall -ED discussed with trauma service, Dr. Michaelle Birks who will evaluate the patient on transfer to [facility]  Elevated troponin -No complaints of chest pain or acute EKG changes -Overall trend appears to be relatively flat -Echocardiogram has been ordered  Elevated LFTs -Question reactive to underlying illness/volume overload/CHF -Right upper quadrant ultrasound performed this morning does not show any acute obstructive process -Continue to monitor with diuresis  Fall at home -Patient was found down -Circumstances of fall are not entirely clear -PT eval  Anxiety -Holding home dose of Valium since it is possible that she was overusing this which could contribute to her fall  Acute metabolic/toxic encephalopathy -Patient was found down and was lethargic -Possibly related to benzodiazepine overuse -She was also noted to have elevated PCO2, but this may be more of a chronic finding -Ammonia level normal -See CT head without acute abnormalities -Overall mental status does appear to be improving   DVT prophylaxis: heparin injection 5,000 Units Start: 03/20/22 2200 SCDs Start: 03/20/22 2003  Code Status: Full code Family Communication: Left voicemail for patient's son Disposition Plan: Status is: Inpatient Remains inpatient appropriate because: We will give a trial off of BiPAP.  Needs further orthopedic evaluation.  Diuresis with IV Lasix.     Consultants:    Procedures:    Antimicrobials:      Subjective: Patient currently on BiPAP, appears to be more awake.  Objective: Vitals:   03/21/22 0800 03/21/22 0830 03/21/22 0900 03/21/22 0900  BP: 124/70 129/85 133/76 (!) 140/80  Pulse: 97 96 88 87  Resp: 19 16 17  18  Temp:    98.9 F (37.2 C)  TempSrc:    Oral  SpO2: 100% 99% 97% 96%   No intake or output data in the 24 hours ending 03/21/22 1055 There were no vitals filed for this visit.  Examination:  General exam: Appears calm and comfortable  Respiratory  system: Clear to auscultation. Respiratory effort normal. Cardiovascular system: S1 & S2 heard, RRR. No JVD, murmurs, rubs, gallops or clicks. No pedal edema. Gastrointestinal system: Abdomen is nondistended, soft and nontender. No organomegaly or masses felt. Normal bowel sounds heard. Central nervous system:  No focal neurological deficits. Extremities: Right lower extremity is in splint Skin: No rashes, lesions or ulcers Psychiatry: Unable to assess since she is on BiPAP    Data Reviewed: I have personally reviewed following labs and imaging studies  CBC: Recent Labs  Lab 03/20/22 1523 03/21/22 0446  WBC 6.9 8.5  NEUTROABS  --  6.7  HGB 12.5 12.5  HCT 39.3 38.7  MCV 108.9* 107.5*  PLT 114* [id number]*   Basic Metabolic Panel: Recent Labs  Lab 03/20/22 1523 03/20/22 1747 03/21/22 0446  NA 141 141 138  K 4.0 4.0 3.6  CL 100 101 99  CO2 34* 34* 33*  GLUCOSE 109* 116* 97  BUN 27* 27* 22  CREATININE 1.17* 1.09* 0.94  CALCIUM 8.6* 8.4* 8.4*  MG  --   --  1.9   GFR: CrCl cannot be calculated (Unknown ideal weight.). Liver Function Tests: Recent Labs  Lab 03/20/22 1523 03/21/22 0446  AST 399* 283*  ALT 211* 197*  ALKPHOS 84 83  BILITOT 1.1 1.7*  PROT 5.9* 5.7*  ALBUMIN 3.2* 3.1*   Recent Labs  Lab 03/20/22 1747  LIPASE 43   Recent Labs  Lab 03/20/22 1523  AMMONIA 12   Coagulation Profile: Recent Labs  Lab 03/20/22 1523  INR 1.7*   Cardiac Enzymes: Recent Labs  Lab 03/20/22 1523  CKTOTAL 149   BNP (last 3 results) No results for input(s): "PROBNP" in the last 8760 hours. HbA1C: No results for input(s): "HGBA1C" in the last 72 hours. CBG: No results for input(s): "GLUCAP" in the last 168 hours. Lipid Profile: No results for input(s): "CHOL", "HDL", "LDLCALC", "TRIG", "CHOLHDL", "LDLDIRECT" in the last 72 hours. Thyroid Function Tests: Recent Labs    03/21/22 0447  TSH 2.546   Anemia Panel: No results for input(s): "VITAMINB12", "FOLATE",  "FERRITIN", "TIBC", "IRON", "RETICCTPCT" in the last 72 hours. Sepsis Labs: Recent Labs  Lab 03/20/22 1523  LATICACIDVEN 1.5    Recent Results (from the past 240 hour(s))  Resp Panel by RT-PCR (Flu A&B, Covid) Anterior Nasal Swab     Status: None   Collection Time: 03/20/22  3:20 PM   Specimen: Anterior Nasal Swab  Result Value Ref Range Status   SARS Coronavirus 2 by RT PCR NEGATIVE NEGATIVE Final    Comment: (NOTE) SARS-CoV-2 target nucleic acids are NOT DETECTED.  The SARS-CoV-2 RNA is generally detectable in upper respiratory specimens during the acute phase of infection. The lowest concentration of SARS-CoV-2 viral copies this assay can detect is 138 copies/mL. A negative result does not preclude SARS-Cov-2 infection and should not be used as the sole basis for treatment or other patient management decisions. A negative result may occur with  improper specimen collection/handling, submission of specimen other than nasopharyngeal swab, presence of viral mutation(s) within the areas targeted by this assay, and inadequate number of viral copies(<138 copies/mL). A negative result must be combined with clinical observations,  patient history, and epidemiological information. The expected result is Negative.  Fact Sheet for Patients:  EntrepreneurPulse.com.au  Fact Sheet for Healthcare Providers:  IncredibleEmployment.be  This test is no t yet approved or cleared by the [place] FDA and  has been authorized for detection and/or diagnosis of SARS-CoV-2 by FDA under an Emergency Use Authorization (EUA). This EUA will remain  in effect (meaning this test can be used) for the duration of the COVID-19 declaration under Section 564(b)(1) of the Act, 21 U.S.C.section 360bbb-3(b)(1), unless the authorization is terminated  or revoked sooner.       Influenza A by PCR NEGATIVE NEGATIVE Final   Influenza B by PCR NEGATIVE NEGATIVE Final     Comment: (NOTE) The Xpert Xpress SARS-CoV-2/FLU/RSV plus assay is intended as an aid in the diagnosis of influenza from Nasopharyngeal swab specimens and should not be used as a sole basis for treatment. Nasal washings and aspirates are unacceptable for Xpert Xpress SARS-CoV-2/FLU/RSV testing.  Fact Sheet for Patients: EntrepreneurPulse.com.au  Fact Sheet for Healthcare Providers: IncredibleEmployment.be  This test is not yet approved or cleared by the [place] FDA and has been authorized for detection and/or diagnosis of SARS-CoV-2 by FDA under an Emergency Use Authorization (EUA). This EUA will remain in effect (meaning this test can be used) for the duration of the COVID-19 declaration under Section 564(b)(1) of the Act, 21 U.S.C. section 360bbb-3(b)(1), unless the authorization is terminated or revoked.  Performed at [facility], [address], [place], [place] [address]          Radiology Studies: US Abdomen Limited RUQ (LIVER/GB)  Result Date: 03/21/2022 CLINICAL DATA:  Liver failure EXAM: ULTRASOUND ABDOMEN LIMITED RIGHT UPPER QUADRANT COMPARISON:  CT abdomen pelvis 03/20/2022 FINDINGS: Gallbladder: Gallstones: None Sludge: None Gallbladder Wall: Within normal limits Pericholecystic fluid: None Sonographic Murphy's Sign: Negative per technologist Common bile duct: Diameter: 3 mm Liver: Parenchymal echogenicity: Within normal limits Contours: Normal Lesions: None Portal vein: Patent.  Hepatopetal flow Other: Trace perihepatic ascites. Minimal right pleural effusion partially visualized. IMPRESSION: 1. No significant sonographic abnormality of the gallbladder or liver. 2. Trace perihepatic ascites and minimal right pleural effusion. Electronically Signed   By: Miachel Roux M.D.   On: 03/21/2022 09:19   DG Tibia/Fibula Right  Result Date: 03/20/2022 CLINICAL DATA:  Known tibial and fibular fractures following reduction, initial  encounter EXAM: RIGHT TIBIA AND FIBULA - 2 VIEW COMPARISON:  Film from earlier in the same day. FINDINGS: Casting material is now noted in place. Previously seen tibial fracture has been reduced somewhat. Proximal fibular fracture is again noted and stable. No new focal abnormality is noted. IMPRESSION: Slight reduction of tibial fracture site. Casting material is noted. Electronically Signed   By: Inez Catalina M.D.   On: 03/20/2022 21:40   CT CHEST ABDOMEN PELVIS W CONTRAST  Result Date: 03/20/2022 CLINICAL DATA:  Fall, found on floor EXAM: CT CHEST, ABDOMEN, AND PELVIS WITH CONTRAST TECHNIQUE: Multidetector CT imaging of the chest, abdomen and pelvis was performed following the standard protocol during bolus administration of intravenous contrast. RADIATION DOSE REDUCTION: This exam was performed according to the departmental dose-optimization program which includes automated exposure control, adjustment of the mA and/or kV according to patient size and/or use of iterative reconstruction technique. CONTRAST:  133mL OMNIPAQUE IOHEXOL 300 MG/ML  SOLN COMPARISON:  CT abdomen pelvis, 03/18/2010 FINDINGS: CT CHEST FINDINGS Cardiovascular: Aortic atherosclerosis. Normal heart size. No pericardial effusion. Mediastinum/Nodes: No enlarged mediastinal, hilar, or axillary lymph nodes. Thyroid gland,  trachea, and esophagus demonstrate no significant findings. Lungs/Pleura: Severe emphysema. Mild, diffuse bilateral bronchial wall thickening. Interlobular septal thickening. Small bilateral pleural effusions and associated atelectasis or consolidation. Musculoskeletal: No chest wall abnormality. Mildly displaced, acute fractures of the lateral and posterior left seventh through twelfth ribs. CT ABDOMEN PELVIS FINDINGS Hepatobiliary: No solid liver abnormality is seen. No gallstones, gallbladder wall thickening, or biliary dilatation. Pancreas: Unremarkable. No pancreatic ductal dilatation or surrounding inflammatory  changes. Spleen: Normal in size without significant abnormality. Adrenals/Urinary Tract: Adrenal glands are unremarkable. 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 The left kidney is normal, without renal calculi, solid lesion, or hydronephrosis. Bladder is unremarkable. Stomach/Bowel: Stomach is within normal limits. Appendix appears normal. No evidence of bowel wall thickening, distention, or inflammatory changes. Vascular/Lymphatic: Aortic atherosclerosis. No enlarged abdominal or pelvic lymph nodes. Reproductive: No mass or other abnormality. Other: No abdominal wall hernia or abnormality. Small volume perihepatic ascites. Musculoskeletal: No acute osseous findings. IMPRESSION: 1. Mildly displaced, acute fractures of the lateral and posterior left seventh through twelfth ribs. 2. Small bilateral pleural effusions and associated atelectasis or consolidation. No associated pneumothorax. 3. Diffuse bilateral bronchial wall thickening and interlobular septal thickening, most consistent with pulmonary edema. 4. Severe emphysema. 5. Small volume simple fluid attenuation perihepatic ascites. No direct CT evidence of abdominal or pelvic organ injury. 6. Solitary left kidney. Aortic Atherosclerosis (ICD10-I70.0) and Emphysema (ICD10-J43.9). Electronically Signed   By: Delanna Ahmadi M.D.   On: 03/20/2022 17:32   CT HEAD WO CONTRAST  Result Date: 03/20/2022 CLINICAL DATA:  Head trauma, moderate-severe; Polytrauma, blunt. Fall EXAM: CT HEAD WITHOUT CONTRAST CT CERVICAL SPINE WITHOUT CONTRAST TECHNIQUE: Multidetector CT imaging of the head and cervical spine was performed following the standard protocol without intravenous contrast. Multiplanar CT image reconstructions of the cervical spine were also generated. RADIATION DOSE REDUCTION: This exam was performed according to the departmental dose-optimization program which includes automated exposure control, adjustment of the mA and/or kV according to  patient size and/or use of iterative reconstruction technique. COMPARISON:  None Available. FINDINGS: CT HEAD FINDINGS Brain: No evidence of large-territorial acute infarction. No parenchymal hemorrhage. No mass lesion. No extra-axial collection. No mass effect or midline shift. No hydrocephalus. Basilar cisterns are patent. Vascular: No hyperdense vessel. Atherosclerotic calcifications are present within the cavernous internal carotid arteries. Skull: No acute fracture or focal lesion. Sinuses/Orbits: Paranasal sinuses and mastoid air cells are clear. Bilateral lens replacement. Otherwise the orbits are unremarkable. Other: None. CT CERVICAL SPINE FINDINGS Alignment: Normal. Skull base and vertebrae: No acute fracture. No aggressive appearing focal osseous lesion or focal pathologic process. Soft tissues and spinal canal: No prevertebral fluid or swelling. No visible canal hematoma. Upper chest: Emphysematous changes. Interlobular septal wall thickening. Other: None. IMPRESSION: 1. No acute intracranial abnormality. 2. No acute displaced fracture or traumatic listhesis of the cervical spine. 3.  Emphysema (ICD10-J43.9). 4. Possible pulmonary edema. Please see separately dictated CT chest 03/20/2022. Electronically Signed   By: Iven Finn M.D.   On: 03/20/2022 17:14   CT CERVICAL SPINE WO CONTRAST  Result Date: 03/20/2022 CLINICAL DATA:  Head trauma, moderate-severe; Polytrauma, blunt. Fall EXAM: CT HEAD WITHOUT CONTRAST CT CERVICAL SPINE WITHOUT CONTRAST TECHNIQUE: Multidetector CT imaging of the head and cervical spine was performed following the standard protocol without intravenous contrast. Multiplanar CT image reconstructions of the cervical spine were also generated. RADIATION DOSE REDUCTION: This exam was performed according to the departmental dose-optimization program which includes automated exposure control, adjustment of the mA  and/or kV according to patient size and/or use of iterative  reconstruction technique. COMPARISON:  None Available. FINDINGS: CT HEAD FINDINGS Brain: No evidence of large-territorial acute infarction. No parenchymal hemorrhage. No mass lesion. No extra-axial collection. No mass effect or midline shift. No hydrocephalus. Basilar cisterns are patent. Vascular: No hyperdense vessel. Atherosclerotic calcifications are present within the cavernous internal carotid arteries. Skull: No acute fracture or focal lesion. Sinuses/Orbits: Paranasal sinuses and mastoid air cells are clear. Bilateral lens replacement. Otherwise the orbits are unremarkable. Other: None. CT CERVICAL SPINE FINDINGS Alignment: Normal. Skull base and vertebrae: No acute fracture. No aggressive appearing focal osseous lesion or focal pathologic process. Soft tissues and spinal canal: No prevertebral fluid or swelling. No visible canal hematoma. Upper chest: Emphysematous changes. Interlobular septal wall thickening. Other: None. IMPRESSION: 1. No acute intracranial abnormality. 2. No acute displaced fracture or traumatic listhesis of the cervical spine. 3.  Emphysema (ICD10-J43.9). 4. Possible pulmonary edema. Please see separately dictated CT chest 03/20/2022. Electronically Signed   By: Iven Finn M.D.   On: 03/20/2022 17:14   DG Pelvis Portable  Result Date: 03/20/2022 CLINICAL DATA:  Trauma EXAM: PORTABLE PELVIS 1-2 VIEWS COMPARISON:  March 09, 2010 FINDINGS: Evaluation is limited by technique. No pelvic diastasis. Degenerative changes of bilateral hips. Limited assessment of the sacrum secondary to overlapping bowel contents. No definitive acute displaced fracture is visualized. IMPRESSION: Limited evaluation due to technique. No definitive acute displaced fracture is visualized. If persistent clinical concern, recommend dedicated cross-sectional imaging or additional radiographic views. Electronically Signed   By: Valentino Saxon M.D.   On: 03/20/2022 16:02   DG Chest Port 1 View  Result  Date: 03/20/2022 CLINICAL DATA:  Trauma EXAM: PORTABLE CHEST 1 VIEW COMPARISON:  March 18, 2010 FINDINGS: The cardiomediastinal silhouette is enlarged in contour, increased since 2011.Atherosclerotic calcifications. No pleural effusion. No pneumothorax. Diffuse coarse interstitial opacities. Questionable nodular opacity at the RIGHT apex. There are several age indeterminate LEFT-sided rib fractures of the approximate fifth and sixth ribs. IMPRESSION: 1. Age indeterminate LEFT-sided rib fractures. Recommend correlation with point tenderness. No pneumothorax is identified. 2. Questionable RIGHT apical nodular opacity versus summation artifact. Consider PA and lateral chest radiograph versus dedicated CT scan for improved evaluation. 3. Increased cardiomegaly in comparison to prior from 2011. 4. Diffuse coarse reticulation may reflect a degree of underlying interstitial lung disease or pulmonary emphysema. Electronically Signed   By: Valentino Saxon M.D.   On: 03/20/2022 16:01   DG Tibia/Fibula Right Port  Result Date: 03/20/2022 CLINICAL DATA:  Blunt Trauma EXAM: PORTABLE RIGHT TIBIA AND FIBULA - 2 VIEW COMPARISON:  None Available. FINDINGS: Osteopenia. There is an oblique fracture of the distal tibial shaft with minimal lateral displacement of the distal fragment. Nondisplaced component extends inferiorly. There is a minimally displaced fracture of the fibular head. No unexpected radiopaque foreign body. Soft tissue edema. IMPRESSION: Minimally displaced oblique fracture of the distal tibial shaft and a minimally displaced fracture of the fibular head. Electronically Signed   By: Valentino Saxon M.D.   On: 03/20/2022 15:57        Scheduled Meds:  heparin  5,000 Units Subcutaneous Q8H   nicotine  21 mg Transdermal Daily   Continuous Infusions:  sodium chloride 50 mL/hr at 03/21/22 0538     LOS: 1 day    Time spent: 37mins    Kathie Dike, MD [facility]   If 7PM-7AM,  please contact night-coverage [email or web address]  03/21/2022, 10:55 AM

## 2022-03-21 NOTE — Progress Notes (Signed)
  Echocardiogram 2D Echocardiogram has been performed.  Valerie Bradley 03/21/2022, 11:11 AM

## 2022-03-22 ENCOUNTER — Inpatient Hospital Stay (HOSPITAL_COMMUNITY): Payer: Medicare Other

## 2022-03-22 DIAGNOSIS — J9601 Acute respiratory failure with hypoxia: Secondary | ICD-10-CM | POA: Diagnosis not present

## 2022-03-22 LAB — CBC WITH DIFFERENTIAL/PLATELET
Abs Immature Granulocytes: 0.02 10*3/uL (ref 0.00–0.07)
Basophils Absolute: 0 10*3/uL (ref 0.0–0.1)
Basophils Relative: 0 %
Eosinophils Absolute: 0 10*3/uL (ref 0.0–0.5)
Eosinophils Relative: 0 %
HCT: 37.9 % (ref 36.0–46.0)
Hemoglobin: 12.8 g/dL (ref 12.0–15.0)
Immature Granulocytes: 0 %
Lymphocytes Relative: 16 %
Lymphs Abs: 1.1 10*3/uL (ref 0.7–4.0)
MCH: 34.2 pg — ABNORMAL HIGH (ref 26.0–34.0)
MCHC: 33.8 g/dL (ref 30.0–36.0)
MCV: 101.3 fL — ABNORMAL HIGH (ref 80.0–100.0)
Monocytes Absolute: 0.4 10*3/uL (ref 0.1–1.0)
Monocytes Relative: 6 %
Neutro Abs: 5.3 10*3/uL (ref 1.7–7.7)
Neutrophils Relative %: 78 %
Platelets: 101 10*3/uL — ABNORMAL LOW (ref 150–400)
RBC: 3.74 MIL/uL — ABNORMAL LOW (ref 3.87–5.11)
RDW: 14.6 % (ref 11.5–15.5)
WBC: 6.8 10*3/uL (ref 4.0–10.5)
nRBC: 0 % (ref 0.0–0.2)

## 2022-03-22 LAB — COMPREHENSIVE METABOLIC PANEL
ALT: 200 U/L — ABNORMAL HIGH (ref 0–44)
AST: 211 U/L — ABNORMAL HIGH (ref 15–41)
Albumin: 2.8 g/dL — ABNORMAL LOW (ref 3.5–5.0)
Alkaline Phosphatase: 72 U/L (ref 38–126)
Anion gap: 11 (ref 5–15)
BUN: 18 mg/dL (ref 8–23)
CO2: 34 mmol/L — ABNORMAL HIGH (ref 22–32)
Calcium: 8.4 mg/dL — ABNORMAL LOW (ref 8.9–10.3)
Chloride: 91 mmol/L — ABNORMAL LOW (ref 98–111)
Creatinine, Ser: 1.04 mg/dL — ABNORMAL HIGH (ref 0.44–1.00)
GFR, Estimated: 59 mL/min — ABNORMAL LOW (ref >60)
Glucose, Bld: 131 mg/dL — ABNORMAL HIGH (ref 70–99)
Potassium: 3 mmol/L — ABNORMAL LOW (ref 3.5–5.1)
Sodium: 136 mmol/L (ref 135–145)
Total Bilirubin: 2.2 mg/dL — ABNORMAL HIGH (ref 0.3–1.2)
Total Protein: 5.2 g/dL — ABNORMAL LOW (ref 6.5–8.1)

## 2022-03-22 MED ORDER — ACETAMINOPHEN 325 MG PO TABS
650.0000 mg | ORAL_TABLET | Freq: Four times a day (QID) | ORAL | Status: DC
  Administered 2022-03-22 – 2022-03-30 (×32): 650 mg via ORAL
  Filled 2022-03-22 (×31): qty 2

## 2022-03-22 MED ORDER — IPRATROPIUM-ALBUTEROL 0.5-2.5 (3) MG/3ML IN SOLN: 3.0000 mL | Freq: Four times a day (QID) | RESPIRATORY_TRACT | Status: DC | PRN

## 2022-03-22 MED ORDER — ACETAMINOPHEN 650 MG RE SUPP: 650.0000 mg | Freq: Four times a day (QID) | RECTAL | Status: DC

## 2022-03-22 MED ORDER — METHOCARBAMOL 500 MG PO TABS: 500.0000 mg | ORAL_TABLET | Freq: Four times a day (QID) | ORAL | Status: DC

## 2022-03-22 MED ORDER — LIDOCAINE 5 % EX PTCH
1.0000 | MEDICATED_PATCH | CUTANEOUS | Status: DC
  Administered 2022-03-22 – 2022-03-30 (×9): 1 via TRANSDERMAL
  Filled 2022-03-22 (×9): qty 1

## 2022-03-22 NOTE — Progress Notes (Signed)
PROGRESS NOTE    Valerie RakeJudith F Bradley  ZOX:[id number] DOB: 07/05/1955 DOA: 03/20/2022 PCP: Pcp, No    Brief Narrative:  66 year old female with a history of anxiety on Valium, was brought to the hospital after a fall.  She was noted to be lethargic when she was found on the floor.  Work-up in the emergency room showed that she did have several rib fractures in the left chest, also noted to have a right tibia/fibula fracture.  She was noted to be hypoxic and hypercapnic and was initially placed on BiPAP.  Further imaging did indicate some volume overload.  EDP discussed case with trauma service as well as [facility]opedics at [facility].  It was recommended patient be admitted to [facility] where these services can consult on her.   Assessment & Plan:   Principal Problem:   Acute respiratory failure with hypoxia (HCC) Active Problems:   Acute metabolic encephalopathy   Traumatic closed displaced fracture of rib on left side   Closed tibia fracture   Fibula fracture   Anxiety   Fall at home, initial encounter   Tobacco use disorder   Elevated troponin   Acute respiratory failure with hypoxia and hypercapnia/undiagnosed underlying COPD with atelectasis: -Possibly related to hypoventilation secondary to medications On admission, she was hypoxic and hypercapnic.  She was placed on BiPAP.  Currently she is on 4 to 5 L oxygen.  Lungs clear to auscultation with some diminished breath sounds at the bases.  She has been smoking 1-1/2 pack/day for several years.  Likely has COPD which is not diagnosed yet.  We will continue to wean to room air.  Pulmonary edema: There is no evidence of pulmonary edema, chest x-ray negative for that, no BNP was obtained.  She does not have crackles.  Pulmonary edema ruled out.  Echo is also not showing any diastolic or systolic congestive heart failure.  [facility].  Closed right tibia and fibula fracture -ED discussed with orthopedics, Dr. Renaye Rakersim  Murphy -Currently right leg placed in splint -Orthopedics aware.  Left-sided rib fractures, 7th-12th ribs -Secondary to fall -ED discussed with trauma service, Dr. Sophronia SimasShelby Allen who will evaluate patient.  Elevated troponin: Likely demand ischemia and trauma related.  Echo negative for wall motion abnormality.  AKI: Resolved.  Elevated LFTs: Improving. -Right upper quadrant ultrasound does not show any acute obstructive process  Fall at home -Patient was found down -Circumstances of fall are not entirely clear -PT eval  Anxiety -Holding home dose of Valium since it is possible that she was overusing this which could contribute to her fall  Acute metabolic/toxic encephalopathy -Patient was found down and was lethargic -Possibly related to benzodiazepine overuse -She was also noted to have elevated PCO2, but this may be more of a chronic finding -Ammonia level normal -See CT head without acute abnormalities -Overall mental status does appear to be improving, currently he is fully alert and oriented.  Severe protein calorie malnutrition: We will consult nutrition.  DVT prophylaxis: heparin injection 5,000 Units Start: 03/20/22 2200 SCDs Start: 03/20/22 2003  Code Status: Full code Family Communication: Left voicemail for patient's son Disposition Plan: Status is: Inpatient Remains inpatient appropriate because: We will give a trial off of BiPAP.  Needs further orthopedic evaluation.  Diuresis with IV Lasix.     Consultants:  Trauma surgery Ortho   Procedures:    Antimicrobials:      Subjective:  Patient seen and examined.  She has no complaints other  than right leg pain.  She is fully alert and oriented.  Objective: Vitals:   03/21/22 2321 03/22/22 0310 03/22/22 0552 03/22/22 0710  BP: 116/62 115/61  (!) 109/56  Pulse: 93 95  95  Resp: Temp: 97.9 F (36.6 C) (!) 97.5 F (36.4 C)  97.9 F (36.6 C)  TempSrc: Oral Oral  Oral  SpO2: 100% 97%   100%  Weight:   38.7 kg    No intake or output data in the 24 hours ending 03/22/22 0943 Filed Weights   03/22/22 0552  Weight: 38.7 kg    Examination:  General exam: Appears calm and comfortable, very thin and cachectic. Respiratory system: Clear to auscultation. Respiratory effort normal. Cardiovascular system: S1 & S2 heard, RRR. No JVD, murmurs, rubs, gallops or clicks. No pedal edema. Gastrointestinal system: Abdomen is nondistended, soft and nontender. No organomegaly or masses felt. Normal bowel sounds heard. Central nervous system: Alert and oriented. No focal neurological deficits. Extremities: Right lower extremity in a splint. Skin: No rashes, lesions or ulcers.  Psychiatry: Judgement and insight appear normal.   Data Reviewed: I have personally reviewed following labs and imaging studies  CBC: Recent Labs  Lab 03/20/22 1523 03/21/22 0446  WBC 6.9 8.5  NEUTROABS  --  6.7  HGB 12.5 12.5  HCT 39.3 38.7  MCV 108.9* 107.5*  PLT 114* 106*    Basic Metabolic Panel: Recent Labs  Lab 03/20/22 1523 03/20/22 1747 03/21/22 0446  NA 141 141 138  K 4.0 4.0 3.6  CL 100 101 99  CO2 34* 34* 33*  GLUCOSE 109* 116* 97  BUN 27* 27* 22  CREATININE 1.17* 1.09* 0.94  CALCIUM 8.6* 8.4* 8.4*  MG  --   --  1.9    GFR: CrCl cannot be calculated (Unknown ideal weight.). Liver Function Tests: Recent Labs  Lab 03/20/22 1523 03/21/22 0446  AST 399* 283*  ALT 211* 197*  ALKPHOS 84 83  BILITOT 1.1 1.7*  PROT 5.9* 5.7*  ALBUMIN 3.2* 3.1*    Recent Labs  Lab 03/20/22 1747  LIPASE 43    Recent Labs  Lab 03/20/22 1523  AMMONIA 12    Coagulation Profile: Recent Labs  Lab 03/20/22 1523  INR 1.7*    Cardiac Enzymes: Recent Labs  Lab 03/20/22 1523  CKTOTAL 149    BNP (last 3 results) No results for input(s): "PROBNP" in the last 8760 hours. HbA1C: No results for input(s): "HGBA1C" in the last 72 hours. CBG: No results for input(s): "GLUCAP" in the  last 168 hours. Lipid Profile: No results for input(s): "CHOL", "HDL", "LDLCALC", "TRIG", "CHOLHDL", "LDLDIRECT" in the last 72 hours. Thyroid Function Tests: Recent Labs    03/21/22 0447  TSH 2.546    Anemia Panel: No results for input(s): "VITAMINB12", "FOLATE", "FERRITIN", "TIBC", "IRON", "RETICCTPCT" in the last 72 hours. Sepsis Labs: Recent Labs  Lab 03/20/22 1523  LATICACIDVEN 1.5     Recent Results (from the past 240 hour(s))  Resp Panel by RT-PCR (Flu A&B, Covid) Anterior Nasal Swab     Status: None   Collection Time: 03/20/22  3:20 PM   Specimen: Anterior Nasal Swab  Result Value Ref Range Status   SARS Coronavirus 2 by RT PCR NEGATIVE NEGATIVE Final    Comment: (NOTE) SARS-CoV-2 target nucleic acids are NOT DETECTED.  The SARS-CoV-2 RNA is generally detectable in upper respiratory specimens during the acute phase of infection. The lowest concentration of SARS-CoV-2 viral copies this assay  can detect is 138 copies/mL. A negative result does not preclude SARS-Cov-2 infection and should not be used as the sole basis for treatment or other patient management decisions. A negative result may occur with  improper specimen collection/handling, submission of specimen other than nasopharyngeal swab, presence of viral mutation(s) within the areas targeted by this assay, and inadequate number of viral copies(<138 copies/mL). A negative result must be combined with clinical observations, patient history, and epidemiological information. The expected result is Negative.  Fact Sheet for Patients:  BloggerCourse.com  Fact Sheet for Healthcare Providers:  SeriousBroker.it  This test is no t yet approved or cleared by the [place] FDA and  has been authorized for detection and/or diagnosis of SARS-CoV-2 by FDA under an Emergency Use Authorization (EUA). This EUA will remain  in effect (meaning this test can be used) for  the duration of the COVID-19 declaration under Section 564(b)(1) of the Act, 21 U.S.C.section 360bbb-3(b)(1), unless the authorization is terminated  or revoked sooner.       Influenza A by PCR NEGATIVE NEGATIVE Final   Influenza B by PCR NEGATIVE NEGATIVE Final    Comment: (NOTE) The Xpert Xpress SARS-CoV-2/FLU/RSV plus assay is intended as an aid in the diagnosis of influenza from Nasopharyngeal swab specimens and should not be used as a sole basis for treatment. Nasal washings and aspirates are unacceptable for Xpert Xpress SARS-CoV-2/FLU/RSV testing.  Fact Sheet for Patients: BloggerCourse.com  Fact Sheet for Healthcare Providers: SeriousBroker.it  This test is not yet approved or cleared by the [place] FDA and has been authorized for detection and/or diagnosis of SARS-CoV-2 by FDA under an Emergency Use Authorization (EUA). This EUA will remain in effect (meaning this test can be used) for the duration of the COVID-19 declaration under Section 564(b)(1) of the Act, 21 U.S.C. section 360bbb-3(b)(1), unless the authorization is terminated or revoked.  Performed at [facility], [address], [place], [place] [address]          Radiology Studies: DG CHEST PORT 1 VIEW  Result Date: 03/22/2022 CLINICAL DATA:  Multiple rib fractures EXAM: PORTABLE CHEST 1 VIEW COMPARISON:  Portable exam 0813 hours compared to 03/20/2022 FINDINGS: Upper normal heart size. Mediastinal contours and pulmonary vascularity normal. Atherosclerotic calcification aorta. Emphysematous changes with BILATERAL lower lobe atelectasis and small RIGHT pleural effusion. No definite infiltrate or pneumothorax. Diffuse osseous demineralization. Known LEFT rib fractures by recent CT are inadequately demonstrated radiographically. IMPRESSION: COPD changes with bibasilar atelectasis and small RIGHT pleural effusion. Aortic Atherosclerosis (ICD10-I70.0) and  Emphysema (ICD10-J43.9). Electronically Signed   By: Ulyses Southward M.D.   On: 03/22/2022 08:42   ECHOCARDIOGRAM COMPLETE  Result Date: 03/21/2022    ECHOCARDIOGRAM REPORT   Patient Name:   FEIGE LOWDERMILK Date of Exam: 03/21/2022 Medical Rec #:  [id number]      Height:       60.0 in Accession #:    [id number]     Weight:       115.0 lb Date of Birth:  [birth year]      BSA:          1.475 m Patient Age:    65 years       BP:           140/80 mmHg Patient Gender: F              HR:           96 bpm. [facility] Procedure: 2D Echo, Cardiac  Doppler and Color Doppler Indications:    R06.02 SOB. Acute respiratory distress.  History:        Patient has no prior history of Echocardiogram examinations.                 Signs/Symptoms:Shortness of Breath and Dyspnea; Risk                 Factors:Current Smoker. Elevated troponin.  Sonographer:    Roseanna Rainbow RDCS Referring Phys: [id number] ASIA B [place]  Sonographer Comments: Technically difficult study due to poor echo windows, suboptimal parasternal window, suboptimal apical window and suboptimal subcostal window. Patient moving constantly during exam. Unable to obtain on- axis images. Patient could not move due to broken leg. Attempted to move from right decubitus position to sight left decubitus. Patient moaning during test. IMPRESSIONS  1. Limited windows and study. Left ventricular ejection fraction, by estimation, is 60 to 65%. The left ventricle has normal function. The left ventricle has no regional wall motion abnormalities. Left ventricular diastolic parameters are indeterminate.  2. Right ventricular systolic function is normal. The right ventricular size is not well visualized. Tricuspid regurgitation signal is inadequate for assessing PA pressure.  3. Moderate pleural effusion.  4. Mild mitral valve regurgitation.  5. The aortic valve was not well visualized. Aortic valve regurgitation is not visualized.  6. The inferior vena cava is dilated in size  with <50% respiratory variability, suggesting right atrial pressure of 15 mmHg. Comparison(s): No prior Echocardiogram. Conclusion(s)/Recommendation(s): Normal biventricular function without evidence of hemodynamically significant valvular heart disease. FINDINGS  Left Ventricle: Limited windows and study. Left ventricular ejection fraction, by estimation, is 60 to 65%. The left ventricle has normal function. The left ventricle has no regional wall motion abnormalities. The left ventricular internal cavity size was normal in size. There is no left ventricular hypertrophy. Left ventricular diastolic parameters are indeterminate. Right Ventricle: The right ventricular size is not well visualized. Right ventricular systolic function is normal. Tricuspid regurgitation signal is inadequate for assessing PA pressure. Left Atrium: Left atrial size was normal in size. Right Atrium: Right atrial size was normal in size. Pericardium: There is no evidence of pericardial effusion. Mitral Valve: Mild mitral valve regurgitation. Tricuspid Valve: Tricuspid valve regurgitation is not demonstrated. Aortic Valve: The aortic valve was not well visualized. Aortic valve regurgitation is not visualized. Pulmonic Valve: The pulmonic valve was grossly normal. Pulmonic valve regurgitation is not visualized. Aorta: The aortic root and ascending aorta are structurally normal, with no evidence of dilitation. Venous: The inferior vena cava is dilated in size with less than 50% respiratory variability, suggesting right atrial pressure of 15 mmHg. IAS/Shunts: The interatrial septum was not well visualized. Additional Comments: There is a moderate pleural effusion.  LEFT VENTRICLE PLAX 2D LVIDd:         3.40 cm     Diastology LVIDs:         2.20 cm     LV e' medial:    5.03 cm/s LV PW:         0.90 cm     LV E/e' medial:  17.6 LV IVS:        0.90 cm     LV e' lateral:   4.46 cm/s LVOT diam:     1.80 cm     LV E/e' lateral: 19.9 LV SV:         28  LV SV Index:   19 LVOT Area:     2.54 cm  LV Volumes (MOD) LV vol d, MOD A4C: 34.0 ml LV vol s, MOD A4C: 11.5 ml LV SV MOD A4C:     34.0 ml RIGHT VENTRICLE            IVC RV S prime:     7.14 cm/s  IVC diam: 2.60 cm LEFT ATRIUM           Index        RIGHT ATRIUM           Index LA diam:      2.90 cm 1.97 cm/m   RA Area:     10.10 cm LA Vol (A4C): 19.6 ml 13.28 ml/m  RA Volume:   19.50 ml  13.22 ml/m  AORTIC VALVE             PULMONIC VALVE LVOT Vmax:   61.70 cm/s  PR End Diast Vel: 1.77 msec LVOT Vmean:  40.400 cm/s LVOT VTI:    0.111 m  AORTA Ao Root diam: 2.60 cm Ao Asc diam:  2.90 cm MITRAL VALVE MV Area (PHT): 5.02 cm    SHUNTS MV Decel Time: 151 msec    Systemic VTI:  0.11 m MV E velocity: 88.60 cm/s  Systemic Diam: 1.80 cm MV A velocity: 60.90 cm/s MV E/A ratio:  1.45 Photographer signed by Carolan Clines Signature Date/Time: 03/21/2022/12:19:34 PM    Final    US Abdomen Limited RUQ (LIVER/GB)  Result Date: 03/21/2022 CLINICAL DATA:  Liver failure EXAM: ULTRASOUND ABDOMEN LIMITED RIGHT UPPER QUADRANT COMPARISON:  CT abdomen pelvis 03/20/2022 FINDINGS: Gallbladder: Gallstones: None Sludge: None Gallbladder Wall: Within normal limits Pericholecystic fluid: None Sonographic Murphy's Sign: Negative per technologist Common bile duct: Diameter: 3 mm Liver: Parenchymal echogenicity: Within normal limits Contours: Normal Lesions: None Portal vein: Patent.  Hepatopetal flow Other: Trace perihepatic ascites. Minimal right pleural effusion partially visualized. IMPRESSION: 1. No significant sonographic abnormality of the gallbladder or liver. 2. Trace perihepatic ascites and minimal right pleural effusion. Electronically Signed   By: Acquanetta Belling M.D.   On: 03/21/2022 09:19   DG Tibia/Fibula Right  Result Date: 03/20/2022 CLINICAL DATA:  Known tibial and fibular fractures following reduction, initial encounter EXAM: RIGHT TIBIA AND FIBULA - 2 VIEW COMPARISON:  Film from earlier in the same day.  FINDINGS: Casting material is now noted in place. Previously seen tibial fracture has been reduced somewhat. Proximal fibular fracture is again noted and stable. No new focal abnormality is noted. IMPRESSION: Slight reduction of tibial fracture site. Casting material is noted. Electronically Signed   By: Alcide Clever M.D.   On: 03/20/2022 21:40   CT CHEST ABDOMEN PELVIS W CONTRAST  Result Date: 03/20/2022 CLINICAL DATA:  Fall, found on floor EXAM: CT CHEST, ABDOMEN, AND PELVIS WITH CONTRAST TECHNIQUE: Multidetector CT imaging of the chest, abdomen and pelvis was performed following the standard protocol during bolus administration of intravenous contrast. RADIATION DOSE REDUCTION: This exam was performed according to the departmental dose-optimization program which includes automated exposure control, adjustment of the mA and/or kV according to patient size and/or use of iterative reconstruction technique. CONTRAST:  OMNIPAQUE IOHEXOL 300 MG/ML  SOLN COMPARISON:  CT abdomen pelvis, 03/18/2010 FINDINGS: CT CHEST FINDINGS Cardiovascular: Aortic atherosclerosis. Normal heart size. No pericardial effusion. Mediastinum/Nodes: No enlarged mediastinal, hilar, or axillary lymph nodes. Thyroid gland, trachea, and esophagus demonstrate no significant findings. Lungs/Pleura: Severe emphysema. Mild, diffuse bilateral bronchial wall thickening. Interlobular septal thickening. Small bilateral pleural effusions and associated atelectasis or consolidation.  Musculoskeletal: No chest wall abnormality. Mildly displaced, acute fractures of the lateral and posterior left seventh through twelfth ribs. CT ABDOMEN PELVIS FINDINGS Hepatobiliary: No solid liver abnormality is seen. No gallstones, gallbladder wall thickening, or biliary dilatation. Pancreas: Unremarkable. No pancreatic ductal dilatation or surrounding inflammatory changes. Spleen: Normal in size without significant abnormality. Adrenals/Urinary Tract: Adrenal glands  are unremarkable. 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 The left kidney is normal, without renal calculi, solid lesion, or hydronephrosis. Bladder is unremarkable. Stomach/Bowel: Stomach is within normal limits. Appendix appears normal. No evidence of bowel wall thickening, distention, or inflammatory changes. Vascular/Lymphatic: Aortic atherosclerosis. No enlarged abdominal or pelvic lymph nodes. Reproductive: No mass or other abnormality. Other: No abdominal wall hernia or abnormality. Small volume perihepatic ascites. Musculoskeletal: No acute osseous findings. IMPRESSION: 1. Mildly displaced, acute fractures of the lateral and posterior left seventh through twelfth ribs. 2. Small bilateral pleural effusions and associated atelectasis or consolidation. No associated pneumothorax. 3. Diffuse bilateral bronchial wall thickening and interlobular septal thickening, most consistent with pulmonary edema. 4. Severe emphysema. 5. Small volume simple fluid attenuation perihepatic ascites. No direct CT evidence of abdominal or pelvic organ injury. 6. Solitary left kidney. Aortic Atherosclerosis (ICD10-I70.0) and Emphysema (ICD10-J43.9). Electronically Signed   By: Jearld Lesch M.D.   On: 03/20/2022 17:32   CT HEAD WO CONTRAST  Result Date: 03/20/2022 CLINICAL DATA:  Head trauma, moderate-severe; Polytrauma, blunt. Fall EXAM: CT HEAD WITHOUT CONTRAST CT CERVICAL SPINE WITHOUT CONTRAST TECHNIQUE: Multidetector CT imaging of the head and cervical spine was performed following the standard protocol without intravenous contrast. Multiplanar CT image reconstructions of the cervical spine were also generated. RADIATION DOSE REDUCTION: This exam was performed according to the departmental dose-optimization program which includes automated exposure control, adjustment of the mA and/or kV according to patient size and/or use of iterative reconstruction technique. COMPARISON:  None Available. FINDINGS: CT  HEAD FINDINGS Brain: No evidence of large-territorial acute infarction. No parenchymal hemorrhage. No mass lesion. No extra-axial collection. No mass effect or midline shift. No hydrocephalus. Basilar cisterns are patent. Vascular: No hyperdense vessel. Atherosclerotic calcifications are present within the cavernous internal carotid arteries. Skull: No acute fracture or focal lesion. Sinuses/Orbits: Paranasal sinuses and mastoid air cells are clear. Bilateral lens replacement. Otherwise the orbits are unremarkable. Other: None. CT CERVICAL SPINE FINDINGS Alignment: Normal. Skull base and vertebrae: No acute fracture. No aggressive appearing focal osseous lesion or focal pathologic process. Soft tissues and spinal canal: No prevertebral fluid or swelling. No visible canal hematoma. Upper chest: Emphysematous changes. Interlobular septal wall thickening. Other: None. IMPRESSION: 1. No acute intracranial abnormality. 2. No acute displaced fracture or traumatic listhesis of the cervical spine. 3.  Emphysema (ICD10-J43.9). 4. Possible pulmonary edema. Please see separately dictated CT chest 03/20/2022. Electronically Signed   By: Tish Frederickson M.D.   On: 03/20/2022 17:14   CT CERVICAL SPINE WO CONTRAST  Result Date: 03/20/2022 CLINICAL DATA:  Head trauma, moderate-severe; Polytrauma, blunt. Fall EXAM: CT HEAD WITHOUT CONTRAST CT CERVICAL SPINE WITHOUT CONTRAST TECHNIQUE: Multidetector CT imaging of the head and cervical spine was performed following the standard protocol without intravenous contrast. Multiplanar CT image reconstructions of the cervical spine were also generated. RADIATION DOSE REDUCTION: This exam was performed according to the departmental dose-optimization program which includes automated exposure control, adjustment of the mA and/or kV according to patient size and/or use of iterative reconstruction technique. COMPARISON:  None Available. FINDINGS: CT HEAD FINDINGS Brain: No evidence of  large-territorial acute infarction.  No parenchymal hemorrhage. No mass lesion. No extra-axial collection. No mass effect or midline shift. No hydrocephalus. Basilar cisterns are patent. Vascular: No hyperdense vessel. Atherosclerotic calcifications are present within the cavernous internal carotid arteries. Skull: No acute fracture or focal lesion. Sinuses/Orbits: Paranasal sinuses and mastoid air cells are clear. Bilateral lens replacement. Otherwise the orbits are unremarkable. Other: None. CT CERVICAL SPINE FINDINGS Alignment: Normal. Skull base and vertebrae: No acute fracture. No aggressive appearing focal osseous lesion or focal pathologic process. Soft tissues and spinal canal: No prevertebral fluid or swelling. No visible canal hematoma. Upper chest: Emphysematous changes. Interlobular septal wall thickening. Other: None. IMPRESSION: 1. No acute intracranial abnormality. 2. No acute displaced fracture or traumatic listhesis of the cervical spine. 3.  Emphysema (ICD10-J43.9). 4. Possible pulmonary edema. Please see separately dictated CT chest 03/20/2022. Electronically Signed   By: Tish Frederickson M.D.   On: 03/20/2022 17:14   DG Pelvis Portable  Result Date: 03/20/2022 CLINICAL DATA:  Trauma EXAM: PORTABLE PELVIS 1-2 VIEWS COMPARISON:  March 09, 2010 FINDINGS: Evaluation is limited by technique. No pelvic diastasis. Degenerative changes of bilateral hips. Limited assessment of the sacrum secondary to overlapping bowel contents. No definitive acute displaced fracture is visualized. IMPRESSION: Limited evaluation due to technique. No definitive acute displaced fracture is visualized. If persistent clinical concern, recommend dedicated cross-sectional imaging or additional radiographic views. Electronically Signed   By: Meda Klinefelter M.D.   On: 03/20/2022 16:02   DG Chest Port 1 View  Result Date: 03/20/2022 CLINICAL DATA:  Trauma EXAM: PORTABLE CHEST 1 VIEW COMPARISON:  March 18, 2010  FINDINGS: The cardiomediastinal silhouette is enlarged in contour, increased since 2011.Atherosclerotic calcifications. No pleural effusion. No pneumothorax. Diffuse coarse interstitial opacities. Questionable nodular opacity at the RIGHT apex. There are several age indeterminate LEFT-sided rib fractures of the approximate fifth and sixth ribs. IMPRESSION: 1. Age indeterminate LEFT-sided rib fractures. Recommend correlation with point tenderness. No pneumothorax is identified. 2. Questionable RIGHT apical nodular opacity versus summation artifact. Consider PA and lateral chest radiograph versus dedicated CT scan for improved evaluation. 3. Increased cardiomegaly in comparison to prior from 2011. 4. Diffuse coarse reticulation may reflect a degree of underlying interstitial lung disease or pulmonary emphysema. Electronically Signed   By: Meda Klinefelter M.D.   On: 03/20/2022 16:01   DG Tibia/Fibula Right Port  Result Date: 03/20/2022 CLINICAL DATA:  Blunt Trauma EXAM: PORTABLE RIGHT TIBIA AND FIBULA - 2 VIEW COMPARISON:  None Available. FINDINGS: Osteopenia. There is an oblique fracture of the distal tibial shaft with minimal lateral displacement of the distal fragment. Nondisplaced component extends inferiorly. There is a minimally displaced fracture of the fibular head. No unexpected radiopaque foreign body. Soft tissue edema. IMPRESSION: Minimally displaced oblique fracture of the distal tibial shaft and a minimally displaced fracture of the fibular head. Electronically Signed   By: Meda Klinefelter M.D.   On: 03/20/2022 15:57        Scheduled Meds:  acetaminophen  650 mg Oral Q6H   Or   acetaminophen  650 mg Rectal Q6H   furosemide  20 mg Intravenous BID   heparin  5,000 Units Subcutaneous Q8H   lidocaine  1 patch Transdermal Q24H   nicotine  21 mg Transdermal Daily   Continuous Infusions:     LOS: 2 days   Hughie Closs, MD [facility]   If 7PM-7AM, please contact  night-coverage [email or web address]  03/22/2022, 9:43 AM

## 2022-03-22 NOTE — Consult Note (Addendum)
Valerie Bradley 09-Apr-1956  [id number].    Requesting MD: Jacqulyn Bath, MD Chief Complaint/Reason for Consult: ground level fall, multiple rib fractures, tib/fib fracture   HPI:  Valerie Bradley is a 66 y/o F with PMH narcotic abuse on suboxone therapy, pulmonary edema, emphysema who presented to [facility] after a ground level fall. Patients tells me that she was at home when she stood up from sitting and fell in her den. She reports LOC for unknown period of time. When she woke up she tried to get up without success. she remembers her son-in-law coming to check on her and calling EMS. Her cc in RLE pain worse with movement. She denies chest pain, nausea, vomiting, or SOB. Denies urinary sxs. 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 Reports smoking 1.5 ppg cigarettes. Denies alcohol use. Denies a known history of cardiac or lung problems. She tells me that she lives alone, mobilizes without an assistive device, and has fallen before.  ROS: As Above  Review of Systems  All other systems reviewed and are negative.   No family history on file.  Past Medical History:  Diagnosis Date   Arthritis     Past Surgical History:  Procedure Laterality Date   BACK SURGERY     TUBAL LIGATION      Social History:  reports that she has been smoking. She has been smoking an average of .5 packs per day. She does not have any smokeless tobacco history on file. She reports current alcohol use. No history on file for drug use.  Allergies: No Known Allergies  Medications Prior to Admission  Medication Sig Dispense Refill   buprenorphine-naloxone (SUBOXONE) 8-2 MG SUBL SL tablet Place 1 tablet under the tongue in the morning, at noon, and at bedtime.     diazepam (VALIUM) 10 MG tablet Take 5 mg by mouth 3 (three) times daily as needed for anxiety.       Physical Exam: Blood pressure (!)  109/56, pulse 95, temperature 97.9 F (36.6 C), temperature source Oral, resp. rate 13, weight 38.7 kg, SpO2 100 %. General: Pleasant white female, appears older than states age, cachectic with diffuse muscle wasting. HEENT: head -normocephalic, atraumatic; Eyes: PERRLA, no conjunctival injection; Ears- no external lesions or tenderness Neck- Trachea is midline, no thyromegaly, no neck pain  CV- sinus tachycardia, no lower extremity edema Pulm- breathing is non-labored on nasal cannula, lungs CTAB with distant breath sounds. Abd- soft, NT/ND, appropriate bowel sounds in 4 quadrants, no masses, hernias, or organomegaly. MSK- UE symmetrical without edema. No point tenderness over wrists, elbows, shoulders. Grip strength 5/5 bilaterally.  RLE splintes. Toes WWP, wiggles toes independently  LLE without edema, 2+ pedal and PT pulses. Neuro- non-focal exam, no paresthesias.gait not assessed Psych- Alert and Oriented x3 with appropriate affect Skin: warm and dry, no rashes or lesions  Results for orders placed or performed during the hospital encounter of 03/20/22 (from the past 48 hour(s))  Urinalysis, Routine w reflex microscopic Urine, Clean Catch     Status: Abnormal   Collection Time: 03/20/22  2:56 PM  Result Value Ref Range   Color, Urine YELLOW YELLOW   APPearance CLEAR CLEAR   Specific Gravity, Urine 1.043 (H) 1.005 - 1.030   pH 7.0 5.0 - 8.0   Glucose, UA NEGATIVE NEGATIVE mg/dL   Hgb urine dipstick SMALL (A) NEGATIVE  Bilirubin Urine NEGATIVE NEGATIVE   Ketones, ur 20 (A) NEGATIVE mg/dL   Protein, ur 30 (A) NEGATIVE mg/dL   Nitrite NEGATIVE NEGATIVE   Leukocytes,Ua NEGATIVE NEGATIVE   RBC / HPF 0-5 0 - 5 RBC/hpf   WBC, UA 0-5 0 - 5 WBC/hpf   Bacteria, UA NONE SEEN NONE SEEN    Comment: Performed at [facility], [address], [place], [place] [address]  Resp Panel by RT-PCR (Flu A&B, Covid) Anterior Nasal Swab     Status: None   Collection Time: 03/20/22  3:20 PM   Specimen:  Anterior Nasal Swab  Result Value Ref Range   SARS Coronavirus 2 by RT PCR NEGATIVE NEGATIVE    Comment: (NOTE) SARS-CoV-2 target nucleic acids are NOT DETECTED.  The SARS-CoV-2 RNA is generally detectable in upper respiratory specimens during the acute phase of infection. The lowest concentration of SARS-CoV-2 viral copies this assay can detect is 138 copies/mL. A negative result does not preclude SARS-Cov-2 infection and should not be used as the sole basis for treatment or other patient management decisions. A negative result may occur with  improper specimen collection/handling, submission of specimen other than nasopharyngeal swab, presence of viral mutation(s) within the areas targeted by this assay, and inadequate number of viral copies(<138 copies/mL). A negative result must be combined with clinical observations, patient history, and epidemiological information. The expected result is Negative.  Fact Sheet for Patients:  BloggerCourse.com  Fact Sheet for Healthcare Providers:  SeriousBroker.it  This test is no t yet approved or cleared by the [place] FDA and  has been authorized for detection and/or diagnosis of SARS-CoV-2 by FDA under an Emergency Use Authorization (EUA). This EUA will remain  in effect (meaning this test can be used) for the duration of the COVID-19 declaration under Section 564(b)(1) of the Act, 21 U.S.C.section 360bbb-3(b)(1), unless the authorization is terminated  or revoked sooner.       Influenza A by PCR NEGATIVE NEGATIVE   Influenza B by PCR NEGATIVE NEGATIVE    Comment: (NOTE) The Xpert Xpress SARS-CoV-2/FLU/RSV plus assay is intended as an aid in the diagnosis of influenza from Nasopharyngeal swab specimens and should not be used as a sole basis for treatment. Nasal washings and aspirates are unacceptable for Xpert Xpress SARS-CoV-2/FLU/RSV testing.  Fact Sheet for  Patients: BloggerCourse.com  Fact Sheet for Healthcare Providers: SeriousBroker.it  This test is not yet approved or cleared by the [place] FDA and has been authorized for detection and/or diagnosis of SARS-CoV-2 by FDA under an Emergency Use Authorization (EUA). This EUA will remain in effect (meaning this test can be used) for the duration of the COVID-19 declaration under Section 564(b)(1) of the Act, 21 U.S.C. section 360bbb-3(b)(1), unless the authorization is terminated or revoked.  Performed at [facility], [address] [place], [place] [address]   Comprehensive metabolic panel     Status: Abnormal   Collection Time: 03/20/22  3:23 PM  Result Value Ref Range   Sodium 141 135 - 145 mmol/L   Potassium 4.0 3.5 - 5.1 mmol/L   Chloride 100 98 - 111 mmol/L   CO2 34 (H) 22 - 32 mmol/L   Glucose, Bld 109 (H) 70 - 99 mg/dL    Comment: Glucose reference range applies only to samples taken after fasting for at least 8 hours.   BUN 27 (H) 8 - 23 mg/dL   Creatinine, Ser 7.84 (H) 0.44 - 1.00 mg/dL   Calcium 8.6 (L) 8.9 -  10.3 mg/dL   Total Protein 5.9 (L) 6.5 - 8.1 g/dL   Albumin 3.2 (L) 3.5 - 5.0 g/dL   AST 409 (H) 15 - 41 U/L   ALT 211 (H) 0 - 44 U/L   Alkaline Phosphatase 84 38 - 126 U/L   Total Bilirubin 1.1 0.3 - 1.2 mg/dL   GFR, Estimated 51 (L) >60 mL/min    Comment: (NOTE) Calculated using the CKD-EPI Creatinine Equation (2021)    Anion gap 7 5 - 15    Comment: Performed at [facility], [address], [place], [place] [address]  CBC     Status: Abnormal   Collection Time: 03/20/22  3:23 PM  Result Value Ref Range   WBC 6.9 4.0 - 10.5 K/uL   RBC 3.61 (L) 3.87 - 5.11 MIL/uL   Hemoglobin 12.5 12.0 - 15.0 g/dL   HCT 47.8 29.5 - 62.1 %   MCV 108.9 (H) 80.0 - 100.0 fL   MCH 34.6 (H) 26.0 - 34.0 pg   MCHC 31.8 30.0 - 36.0 g/dL   RDW 30.8 65.7 - 84.6 %   Platelets 114 (L) 150 - 400 K/uL   nRBC 0.4 (H) 0.0 - 0.2 %     Comment: Performed at [facility], [address] [place], [place] [address]  Lactic acid, plasma     Status: None   Collection Time: 03/20/22  3:23 PM  Result Value Ref Range   Lactic Acid, Venous 1.5 0.5 - 1.9 mmol/L    Comment: Performed at [facility], [address] [place], [place] [address]  Protime-INR     Status: Abnormal   Collection Time: 03/20/22  3:23 PM  Result Value Ref Range   Prothrombin Time 19.7 (H) 11.4 - 15.2 seconds   INR 1.7 (H) 0.8 - 1.2    Comment: (NOTE) INR goal varies based on device and disease states. Performed at [facility], [address], [place], [place] [address]   Sample to Blood Bank     Status: None   Collection Time: 03/20/22  3:23 PM  Result Value Ref Range   Blood Bank Specimen SAMPLE AVAILABLE FOR TESTING    Sample Expiration      03/21/2022,2359 Performed at [facility], [address] [place], [place] [address]   CK     Status: None   Collection Time: 03/20/22  3:23 PM  Result Value Ref Range   Total CK 149 38 - 234 U/L    Comment: Performed at [facility], [address], [place], [place] [address]  Ammonia     Status: None   Collection Time: 03/20/22  3:23 PM  Result Value Ref Range   Ammonia 12 9 - 35 umol/L    Comment: Performed at [facility], [address], [place], [place] [address]  Acetaminophen level     Status: Abnormal   Collection Time: 03/20/22  3:34 PM  Result Value Ref Range   Acetaminophen (Tylenol), Serum <10 (L) 10 - 30 ug/mL    Comment: (NOTE) Therapeutic concentrations vary significantly. A range of 10-30 ug/mL  may be an effective concentration for many patients. However, some  are best treated at concentrations outside of this range. Acetaminophen concentrations >150 ug/mL at 4 hours after ingestion  and >50 ug/mL at 12 hours after ingestion are often associated with  toxic reactions.  Performed at [facility], [address] [place], [place] [address]   Salicylate level     Status: Abnormal    Collection  Time: 03/20/22  3:34 PM  Result Value Ref Range   Salicylate Lvl <7.0 (L) 7.0 - 30.0 mg/dL    Comment: Performed at [facility], [address], [place], [place] [address]  Ethanol     Status: None   Collection Time: 03/20/22  3:34 PM  Result Value Ref Range   Alcohol, Ethyl (B) <10 <10 mg/dL    Comment: (NOTE) Lowest detectable limit for serum alcohol is 10 mg/dL.  For medical purposes only. Performed at [facility], [address] [place], [place] [address]   Blood gas, venous     Status: Abnormal   Collection Time: 03/20/22  4:08 PM  Result Value Ref Range   pH, Ven 7.36 7.25 - 7.43   pCO2, Ven 72 (HH) 44 - 60 mmHg    Comment: CRITICAL RESULT CALLED TO, READ BACK BY AND VERIFIED WITH:  B. BANDS @ 1638 BY STEPHTR 03/20/22    pO2, Ven <31 (LL) 32 - 45 mmHg    Comment: CRITICAL RESULT CALLED TO, READ BACK BY AND VERIFIED WITH:  B. BANDS @ 1638 BY STEPHTR 03/20/22    Bicarbonate 40.7 (H) 20.0 - 28.0 mmol/L   Acid-Base Excess 12.0 (H) 0.0 - 2.0 mmol/L   O2 Saturation 14.3 %   Patient temperature 37.1    Collection site LEFT ANTECUBITAL    Drawn by [phone]     Comment: Performed at [facility], [address] [place], [place] [address]  Lipase, blood     Status: None   Collection Time: 03/20/22  5:47 PM  Result Value Ref Range   Lipase 43 11 - 51 U/L    Comment: Performed at [facility], [address] [place], [place] [address]  Basic metabolic panel     Status: Abnormal   Collection Time: 03/20/22  5:47 PM  Result Value Ref Range   Sodium 141 135 - 145 mmol/L   Potassium 4.0 3.5 - 5.1 mmol/L   Chloride 101 98 - 111 mmol/L   CO2 34 (H) 22 - 32 mmol/L   Glucose, Bld 116 (H) 70 - 99 mg/dL    Comment: Glucose reference range applies only to samples taken after fasting for at least 8 hours.   BUN 27 (H) 8 - 23 mg/dL   Creatinine, Ser 6.96 (H) 0.44 - 1.00 mg/dL   Calcium 8.4 (L) 8.9 - 10.3 mg/dL   GFR, Estimated 56 (L) >60 mL/min    Comment: (NOTE) Calculated  using the CKD-EPI Creatinine Equation (2021)    Anion gap 6 5 - 15    Comment: Performed at [facility], [address], [place], [place] [address]  HIV Antibody (routine testing w rflx)     Status: None   Collection Time: 03/20/22  5:47 PM  Result Value Ref Range   HIV Screen 4th Generation wRfx Non Reactive Non Reactive    Comment: Performed at [facility], [address], [place], [place] [address]  Troponin I (High Sensitivity)     Status: Abnormal   Collection Time: 03/20/22  5:47 PM  Result Value Ref Range   Troponin I (High Sensitivity) 240 (HH) <18 ng/L    Comment: CRITICAL RESULT CALLED TO, READ BACK BY AND VERIFIED WITH: NICHOLS,K AT 2111 ON 03/20/2022 BY MOSLEY,J (NOTE) Elevated high sensitivity troponin I (hsTnI) values and significant  changes across serial measurements may suggest ACS but many other  chronic and acute conditions are known to elevate hsTnI results.  Refer to the Links  section for chest pain algorithms and additional  guidance. Performed at [facility], [address] [place], [place] [address]   Rapid urine drug screen (hospital performed)     Status: Abnormal   Collection Time: 03/20/22  7:22 PM  Result Value Ref Range   Opiates NONE DETECTED NONE DETECTED   Cocaine NONE DETECTED NONE DETECTED   Benzodiazepines POSITIVE (A) NONE DETECTED   Amphetamines NONE DETECTED NONE DETECTED   Tetrahydrocannabinol NONE DETECTED NONE DETECTED   Barbiturates NONE DETECTED NONE DETECTED    Comment: (NOTE) DRUG SCREEN FOR MEDICAL PURPOSES ONLY.  IF CONFIRMATION IS NEEDED FOR ANY PURPOSE, NOTIFY LAB WITHIN 5 DAYS.  LOWEST DETECTABLE LIMITS FOR URINE DRUG SCREEN Drug Class                     Cutoff (ng/mL) Amphetamine and metabolites    1000 Barbiturate and metabolites    200 Benzodiazepine                 200 Tricyclics and metabolites     300 Opiates and metabolites        300 Cocaine and metabolites        300 THC                             50 Performed at [facility], [address] [place], [place] [address]   Troponin I (High Sensitivity)     Status: Abnormal   Collection Time: 03/20/22 10:26 PM  Result Value Ref Range   Troponin I (High Sensitivity) 208 (HH) <18 ng/L    Comment: CRITICAL RESULT CALLED TO, READ BACK BY AND VERIFIED WITH: NICKOLS,K @ 2315 ON 03/20/22 BY JUW Performed at [facility], [address], [place], [place] [address]   Blood gas, arterial     Status: Abnormal   Collection Time: 03/21/22 12:52 AM  Result Value Ref Range   FIO2 40.0 %   pH, Arterial 7.41 7.35 - 7.45   pCO2 arterial 57 (H) 32 - 48 mmHg   pO2, Arterial 74 (L) 83 - 108 mmHg   Bicarbonate 36.1 (H) 20.0 - 28.0 mmol/L   Acid-Base Excess 9.4 (H) 0.0 - 2.0 mmol/L   O2 Saturation 95.2 %   Patient temperature 37.0    Collection site LEFT BRACHIAL    Drawn by [id number]    Allens test (pass/fail) PASS PASS    Comment: Performed at [facility], [address] [place], [place] [address]  Comprehensive metabolic panel     Status: Abnormal   Collection Time: 03/21/22  4:46 AM  Result Value Ref Range   Sodium 138 135 - 145 mmol/L   Potassium 3.6 3.5 - 5.1 mmol/L   Chloride 99 98 - 111 mmol/L   CO2 33 (H) 22 - 32 mmol/L   Glucose, Bld 97 70 - 99 mg/dL    Comment: Glucose reference range applies only to samples taken after fasting for at least 8 hours.   BUN 22 8 - 23 mg/dL   Creatinine, Ser 1.30 0.44 - 1.00 mg/dL   Calcium 8.4 (L) 8.9 - 10.3 mg/dL   Total Protein 5.7 (L) 6.5 - 8.1 g/dL   Albumin 3.1 (L) 3.5 - 5.0 g/dL   AST 865 (H) 15 - 41 U/L   ALT 197 (H) 0 - 44 U/L   Alkaline Phosphatase 83 38 - 126 U/L   Total Bilirubin 1.7 (H) 0.3 -  1.2 mg/dL   GFR, Estimated >16>60 >10>60 mL/min    Comment: (NOTE) Calculated using the CKD-EPI Creatinine Equation (2021)    Anion gap 6 5 - 15    Comment: Performed at [facility], [address] [place], [place] [address]  Magnesium     Status: None   Collection Time: 03/21/22  4:46 AM  Result Value  Ref Range   Magnesium 1.9 1.7 - 2.4 mg/dL    Comment: Performed at [facility], [address] [place], [place] [address]  CBC with Differential/Platelet     Status: Abnormal   Collection Time: 03/21/22  4:46 AM  Result Value Ref Range   WBC 8.5 4.0 - 10.5 K/uL   RBC 3.60 (L) 3.87 - 5.11 MIL/uL   Hemoglobin 12.5 12.0 - 15.0 g/dL   HCT 19.138.7 47.836.0 - 29.546.0 %   MCV 107.5 (H) 80.0 - 100.0 fL   MCH 34.7 (H) 26.0 - 34.0 pg   MCHC 32.3 30.0 - 36.0 g/dL   RDW 62.115.0 30.811.5 - 65.715.5 %   Platelets 106 (L) 150 - 400 K/uL    Comment: Immature Platelet Fraction may be clinically indicated, consider ordering this additional test [id number]    nRBC 0.0 0.0 - 0.2 %   Neutrophils Relative % 78 %   Neutro Abs 6.7 1.7 - 7.7 K/uL   Lymphocytes Relative 13 %   Lymphs Abs 1.1 0.7 - 4.0 K/uL   Monocytes Relative 8 %   Monocytes Absolute 0.7 0.1 - 1.0 K/uL   Eosinophils Relative 0 %   Eosinophils Absolute 0.0 0.0 - 0.5 K/uL   Basophils Relative 0 %   Basophils Absolute 0.0 0.0 - 0.1 K/uL   Immature Granulocytes 1 %   Abs Immature Granulocytes 0.05 0.00 - 0.07 K/uL    Comment: Performed at [facility], [address] [place], [place] [address]  Troponin I (High Sensitivity)     Status: Abnormal   Collection Time: 03/21/22  4:46 AM  Result Value Ref Range   Troponin I (High Sensitivity) 263 (HH) <18 ng/L    Comment: CRITICAL RESULT CALLED TO, READ BACK BY AND VERIFIED WITH: NICHOLS,K AT 6:30AM ON 03/21/22 BY FESTERMAN,C (NOTE) Elevated high sensitivity troponin I (hsTnI) values and significant  changes across serial measurements may suggest ACS but many other  chronic and acute conditions are known to elevate hsTnI results.  Refer to the Links section for chest pain algorithms and additional  guidance. Performed at [facility], [address] [place], [place] [address]   TSH     Status: None   Collection Time: 03/21/22  4:47 AM  Result Value Ref Range   TSH 2.546 0.350 - 4.500 uIU/mL    Comment:  Performed by a 3rd Generation assay with a functional sensitivity of <=0.01 uIU/mL. Performed at [facility], [address] [place], [place] [address]    ECHOCARDIOGRAM COMPLETE  Result Date: 03/21/2022    ECHOCARDIOGRAM REPORT   Patient Name:   Valerie RakeJUDITH F Bradley Date of Exam: 03/21/2022 Medical Rec #:  [id number]      Height:       60.0 in Accession #:    [id number]     Weight:       115.0 lb Date of Birth:  10/10/1955      BSA:          1.475 m Patient Age:    65 years       BP:  140/80 mmHg Patient Gender: F              HR:           96 bpm. [facility] Procedure: 2D Echo, Cardiac Doppler and Color Doppler Indications:    R06.02 SOB. Acute respiratory distress.  History:        Patient has no prior history of Echocardiogram examinations.                 Signs/Symptoms:Shortness of Breath and Dyspnea; Risk                 Factors:Current Smoker. Elevated troponin.  Sonographer:    Sheralyn Boatman RDCS Referring Phys: [id number] ASIA B ZIERLE-GHOSH  Sonographer Comments: Technically difficult study due to poor echo windows, suboptimal parasternal window, suboptimal apical window and suboptimal subcostal window. Patient moving constantly during exam. Unable to obtain on- axis images. Patient could not move due to broken leg. Attempted to move from right decubitus position to sight left decubitus. Patient moaning during test. IMPRESSIONS  1. Limited windows and study. Left ventricular ejection fraction, by estimation, is 60 to 65%. The left ventricle has normal function. The left ventricle has no regional wall motion abnormalities. Left ventricular diastolic parameters are indeterminate.  2. Right ventricular systolic function is normal. The right ventricular size is not well visualized. Tricuspid regurgitation signal is inadequate for assessing PA pressure.  3. Moderate pleural effusion.  4. Mild mitral valve regurgitation.  5. The aortic valve was not well visualized. Aortic valve regurgitation  is not visualized.  6. The inferior vena cava is dilated in size with <50% respiratory variability, suggesting right atrial pressure of 15 mmHg. Comparison(s): No prior Echocardiogram. Conclusion(s)/Recommendation(s): Normal biventricular function without evidence of hemodynamically significant valvular heart disease. FINDINGS  Left Ventricle: Limited windows and study. Left ventricular ejection fraction, by estimation, is 60 to 65%. The left ventricle has normal function. The left ventricle has no regional wall motion abnormalities. The left ventricular internal cavity size was normal in size. There is no left ventricular hypertrophy. Left ventricular diastolic parameters are indeterminate. Right Ventricle: The right ventricular size is not well visualized. Right ventricular systolic function is normal. Tricuspid regurgitation signal is inadequate for assessing PA pressure. Left Atrium: Left atrial size was normal in size. Right Atrium: Right atrial size was normal in size. Pericardium: There is no evidence of pericardial effusion. Mitral Valve: Mild mitral valve regurgitation. Tricuspid Valve: Tricuspid valve regurgitation is not demonstrated. Aortic Valve: The aortic valve was not well visualized. Aortic valve regurgitation is not visualized. Pulmonic Valve: The pulmonic valve was grossly normal. Pulmonic valve regurgitation is not visualized. Aorta: The aortic root and ascending aorta are structurally normal, with no evidence of dilitation. Venous: The inferior vena cava is dilated in size with less than 50% respiratory variability, suggesting right atrial pressure of 15 mmHg. IAS/Shunts: The interatrial septum was not well visualized. Additional Comments: There is a moderate pleural effusion.  LEFT VENTRICLE PLAX 2D LVIDd:         3.40 cm     Diastology LVIDs:         2.20 cm     LV e' medial:    5.03 cm/s LV PW:         0.90 cm     LV E/e' medial:  17.6 LV IVS:        0.90 cm     LV e' lateral:   4.46 cm/s  LVOT diam:  1.80 cm     LV E/e' lateral: 19.9 LV SV:         28 LV SV Index:   19 LVOT Area:     2.54 cm  LV Volumes (MOD) LV vol d, MOD A4C: 34.0 ml LV vol s, MOD A4C: 11.5 ml LV SV MOD A4C:     34.0 ml RIGHT VENTRICLE            IVC RV S prime:     7.14 cm/s  IVC diam: 2.60 cm LEFT ATRIUM           Index        RIGHT ATRIUM           Index LA diam:      2.90 cm 1.97 cm/m   RA Area:     10.10 cm LA Vol (A4C): 19.6 ml 13.28 ml/m  RA Volume:   19.50 ml  13.22 ml/m  AORTIC VALVE             PULMONIC VALVE LVOT Vmax:   61.70 cm/s  PR End Diast Vel: 1.77 msec LVOT Vmean:  40.400 cm/s LVOT VTI:    0.111 m  AORTA Ao Root diam: 2.60 cm Ao Asc diam:  2.90 cm MITRAL VALVE MV Area (PHT): 5.02 cm    SHUNTS MV Decel Time: 151 msec    Systemic VTI:  0.11 m MV E velocity: 88.60 cm/s  Systemic Diam: 1.80 cm MV A velocity: 60.90 cm/s MV E/A ratio:  1.45 Photographer signed by Carolan Clines Signature Date/Time: 03/21/2022/12:19:34 PM    Final    US Abdomen Limited RUQ (LIVER/GB)  Result Date: 03/21/2022 CLINICAL DATA:  Liver failure EXAM: ULTRASOUND ABDOMEN LIMITED RIGHT UPPER QUADRANT COMPARISON:  CT abdomen pelvis 03/20/2022 FINDINGS: Gallbladder: Gallstones: None Sludge: None Gallbladder Wall: Within normal limits Pericholecystic fluid: None Sonographic Murphy's Sign: Negative per technologist Common bile duct: Diameter: 3 mm Liver: Parenchymal echogenicity: Within normal limits Contours: Normal Lesions: None Portal vein: Patent.  Hepatopetal flow Other: Trace perihepatic ascites. Minimal right pleural effusion partially visualized. IMPRESSION: 1. No significant sonographic abnormality of the gallbladder or liver. 2. Trace perihepatic ascites and minimal right pleural effusion. Electronically Signed   By: Acquanetta Belling M.D.   On: 03/21/2022 09:19   DG Tibia/Fibula Right  Result Date: 03/20/2022 CLINICAL DATA:  Known tibial and fibular fractures following reduction, initial encounter EXAM: RIGHT TIBIA  AND FIBULA - 2 VIEW COMPARISON:  Film from earlier in the same day. FINDINGS: Casting material is now noted in place. Previously seen tibial fracture has been reduced somewhat. Proximal fibular fracture is again noted and stable. No new focal abnormality is noted. IMPRESSION: Slight reduction of tibial fracture site. Casting material is noted. Electronically Signed   By: Alcide Clever M.D.   On: 03/20/2022 21:40   CT CHEST ABDOMEN PELVIS W CONTRAST  Result Date: 03/20/2022 CLINICAL DATA:  Fall, found on floor EXAM: CT CHEST, ABDOMEN, AND PELVIS WITH CONTRAST TECHNIQUE: Multidetector CT imaging of the chest, abdomen and pelvis was performed following the standard protocol during bolus administration of intravenous contrast. RADIATION DOSE REDUCTION: This exam was performed according to the departmental dose-optimization program which includes automated exposure control, adjustment of the mA and/or kV according to patient size and/or use of iterative reconstruction technique. CONTRAST:  OMNIPAQUE IOHEXOL 300 MG/ML  SOLN COMPARISON:  CT abdomen pelvis, 03/18/2010 FINDINGS: CT CHEST FINDINGS Cardiovascular: Aortic atherosclerosis. Normal heart size. No pericardial effusion. Mediastinum/Nodes: No enlarged  mediastinal, hilar, or axillary lymph nodes. Thyroid gland, trachea, and esophagus demonstrate no significant findings. Lungs/Pleura: Severe emphysema. Mild, diffuse bilateral bronchial wall thickening. Interlobular septal thickening. Small bilateral pleural effusions and associated atelectasis or consolidation. Musculoskeletal: No chest wall abnormality. Mildly displaced, acute fractures of the lateral and posterior left seventh through twelfth ribs. CT ABDOMEN PELVIS FINDINGS Hepatobiliary: No solid liver abnormality is seen. No gallstones, gallbladder wall thickening, or biliary dilatation. Pancreas: Unremarkable. No pancreatic ductal dilatation or surrounding inflammatory changes. Spleen: Normal in size  without significant abnormality. Adrenals/Urinary Tract: Adrenal glands are unremarkable. 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 The left kidney is normal, without renal calculi, solid lesion, or hydronephrosis. Bladder is unremarkable. Stomach/Bowel: Stomach is within normal limits. Appendix appears normal. No evidence of bowel wall thickening, distention, or inflammatory changes. Vascular/Lymphatic: Aortic atherosclerosis. No enlarged abdominal or pelvic lymph nodes. Reproductive: No mass or other abnormality. Other: No abdominal wall hernia or abnormality. Small volume perihepatic ascites. Musculoskeletal: No acute osseous findings. IMPRESSION: 1. Mildly displaced, acute fractures of the lateral and posterior left seventh through twelfth ribs. 2. Small bilateral pleural effusions and associated atelectasis or consolidation. No associated pneumothorax. 3. Diffuse bilateral bronchial wall thickening and interlobular septal thickening, most consistent with pulmonary edema. 4. Severe emphysema. 5. Small volume simple fluid attenuation perihepatic ascites. No direct CT evidence of abdominal or pelvic organ injury. 6. Solitary left kidney. Aortic Atherosclerosis (ICD10-I70.0) and Emphysema (ICD10-J43.9). Electronically Signed   By: Jearld Lesch M.D.   On: 03/20/2022 17:32   CT HEAD WO CONTRAST  Result Date: 03/20/2022 CLINICAL DATA:  Head trauma, moderate-severe; Polytrauma, blunt. Fall EXAM: CT HEAD WITHOUT CONTRAST CT CERVICAL SPINE WITHOUT CONTRAST TECHNIQUE: Multidetector CT imaging of the head and cervical spine was performed following the standard protocol without intravenous contrast. Multiplanar CT image reconstructions of the cervical spine were also generated. RADIATION DOSE REDUCTION: This exam was performed according to the departmental dose-optimization program which includes automated exposure control, adjustment of the mA and/or kV according to patient size and/or use of  iterative reconstruction technique. COMPARISON:  None Available. FINDINGS: CT HEAD FINDINGS Brain: No evidence of large-territorial acute infarction. No parenchymal hemorrhage. No mass lesion. No extra-axial collection. No mass effect or midline shift. No hydrocephalus. Basilar cisterns are patent. Vascular: No hyperdense vessel. Atherosclerotic calcifications are present within the cavernous internal carotid arteries. Skull: No acute fracture or focal lesion. Sinuses/Orbits: Paranasal sinuses and mastoid air cells are clear. Bilateral lens replacement. Otherwise the orbits are unremarkable. Other: None. CT CERVICAL SPINE FINDINGS Alignment: Normal. Skull base and vertebrae: No acute fracture. No aggressive appearing focal osseous lesion or focal pathologic process. Soft tissues and spinal canal: No prevertebral fluid or swelling. No visible canal hematoma. Upper chest: Emphysematous changes. Interlobular septal wall thickening. Other: None. IMPRESSION: 1. No acute intracranial abnormality. 2. No acute displaced fracture or traumatic listhesis of the cervical spine. 3.  Emphysema (ICD10-J43.9). 4. Possible pulmonary edema. Please see separately dictated CT chest 03/20/2022. Electronically Signed   By: Tish Frederickson M.D.   On: 03/20/2022 17:14   CT CERVICAL SPINE WO CONTRAST  Result Date: 03/20/2022 CLINICAL DATA:  Head trauma, moderate-severe; Polytrauma, blunt. Fall EXAM: CT HEAD WITHOUT CONTRAST CT CERVICAL SPINE WITHOUT CONTRAST TECHNIQUE: Multidetector CT imaging of the head and cervical spine was performed following the standard protocol without intravenous contrast. Multiplanar CT image reconstructions of the cervical spine were also generated. RADIATION DOSE REDUCTION: This exam was performed according to the departmental dose-optimization program which  includes automated exposure control, adjustment of the mA and/or kV according to patient size and/or use of iterative reconstruction technique.  COMPARISON:  None Available. FINDINGS: CT HEAD FINDINGS Brain: No evidence of large-territorial acute infarction. No parenchymal hemorrhage. No mass lesion. No extra-axial collection. No mass effect or midline shift. No hydrocephalus. Basilar cisterns are patent. Vascular: No hyperdense vessel. Atherosclerotic calcifications are present within the cavernous internal carotid arteries. Skull: No acute fracture or focal lesion. Sinuses/Orbits: Paranasal sinuses and mastoid air cells are clear. Bilateral lens replacement. Otherwise the orbits are unremarkable. Other: None. CT CERVICAL SPINE FINDINGS Alignment: Normal. Skull base and vertebrae: No acute fracture. No aggressive appearing focal osseous lesion or focal pathologic process. Soft tissues and spinal canal: No prevertebral fluid or swelling. No visible canal hematoma. Upper chest: Emphysematous changes. Interlobular septal wall thickening. Other: None. IMPRESSION: 1. No acute intracranial abnormality. 2. No acute displaced fracture or traumatic listhesis of the cervical spine. 3.  Emphysema (ICD10-J43.9). 4. Possible pulmonary edema. Please see separately dictated CT chest 03/20/2022. Electronically Signed   By: Tish Frederickson M.D.   On: 03/20/2022 17:14   DG Pelvis Portable  Result Date: 03/20/2022 CLINICAL DATA:  Trauma EXAM: PORTABLE PELVIS 1-2 VIEWS COMPARISON:  March 09, 2010 FINDINGS: Evaluation is limited by technique. No pelvic diastasis. Degenerative changes of bilateral hips. Limited assessment of the sacrum secondary to overlapping bowel contents. No definitive acute displaced fracture is visualized. IMPRESSION: Limited evaluation due to technique. No definitive acute displaced fracture is visualized. If persistent clinical concern, recommend dedicated cross-sectional imaging or additional radiographic views. Electronically Signed   By: Meda Klinefelter M.D.   On: 03/20/2022 16:02   DG Chest Port 1 View  Result Date: 03/20/2022 CLINICAL  DATA:  Trauma EXAM: PORTABLE CHEST 1 VIEW COMPARISON:  March 18, 2010 FINDINGS: The cardiomediastinal silhouette is enlarged in contour, increased since 2011.Atherosclerotic calcifications. No pleural effusion. No pneumothorax. Diffuse coarse interstitial opacities. Questionable nodular opacity at the RIGHT apex. There are several age indeterminate LEFT-sided rib fractures of the approximate fifth and sixth ribs. IMPRESSION: 1. Age indeterminate LEFT-sided rib fractures. Recommend correlation with point tenderness. No pneumothorax is identified. 2. Questionable RIGHT apical nodular opacity versus summation artifact. Consider PA and lateral chest radiograph versus dedicated CT scan for improved evaluation. 3. Increased cardiomegaly in comparison to prior from 2011. 4. Diffuse coarse reticulation may reflect a degree of underlying interstitial lung disease or pulmonary emphysema. Electronically Signed   By: Meda Klinefelter M.D.   On: 03/20/2022 16:01   DG Tibia/Fibula Right Port  Result Date: 03/20/2022 CLINICAL DATA:  Blunt Trauma EXAM: PORTABLE RIGHT TIBIA AND FIBULA - 2 VIEW COMPARISON:  None Available. FINDINGS: Osteopenia. There is an oblique fracture of the distal tibial shaft with minimal lateral displacement of the distal fragment. Nondisplaced component extends inferiorly. There is a minimally displaced fracture of the fibular head. No unexpected radiopaque foreign body. Soft tissue edema. IMPRESSION: Minimally displaced oblique fracture of the distal tibial shaft and a minimally displaced fracture of the fibular head. Electronically Signed   By: Meda Klinefelter M.D.   On: 03/20/2022 15:57      Assessment/Plan 66 y/o F s/p GLF on 03/20/22  L Rib FX 7-12 - multimodal pain control, though she is not having significant chest wall pain. I scheduled tylenol and ordered a lidoderm patch, agree with PRN oxycodone . CXR this AM without PTX.  R tib/fib FX - per ortho  FEN - Heart healthy VTE  - SCD's, currently on  SQH - would recommend transition to lovenox 15mg  BID in this polytrauma patient (reduced dose due to her bodyweight, discussed with pharmacy). ID - none indicated from trauma perspective. Admit - to hospitalist service   No additional acute traumatic injuries identified on tertiary survery - trauma surgery will sign off. Please call as needed.   Below per primary team:  Pulmonary edema  Emphysema/COPD Solitary left kidney  Tobacco abuse  Osteroarthritis    I reviewed nursing notes, Consultant ortho notes, hospitalist notes, last 24 h vitals and pain scores, last 48 h intake and output, last 24 h labs and trends, and last 24 h imaging results.  Jill Alexanders, [facility] Surgery 03/22/2022, 7:54 AM Please see Amion for pager number during day hours 7:00am-4:30pm or 7:00am -11:30am on weekends

## 2022-03-22 NOTE — Progress Notes (Signed)
Deleted: info for wrong patient.

## 2022-03-22 NOTE — Consult Note (Signed)
Reason for Consult:Right tibia fx Referring Physician: Ravi Pahwani Time called: 0841 Time at bedside: 0956   Valerie Bradley is an 66 y.o. female.  HPI: Daly fell at home unwitnessed. She does not remember the details of the fall. She was found down delirious and brought to [facility]. Workup showed a right tibia fx and orthopedic surgery was consulted. She was also quite hypoxic and was transferred to [facility] for further care. Her delirium has cleared. She lives alone and does not use any assistive devices to ambulate.  Past Medical History:  Diagnosis Date   Arthritis     Past Surgical History:  Procedure Laterality Date   BACK SURGERY     TUBAL LIGATION      No family history on file.  Social History:  reports that she has been smoking. She has been smoking an average of .5 packs per day. She does not have any smokeless tobacco history on file. She reports current alcohol use. No history on file for drug use.  Allergies: No Known Allergies  Medications: I have reviewed the patient's current medications.  Results for orders placed or performed during the hospital encounter of 03/20/22 (from the past 48 hour(s))  Urinalysis, Routine w reflex microscopic Urine, Clean Catch     Status: Abnormal   Collection Time: 03/20/22  2:56 PM  Result Value Ref Range   Color, Urine YELLOW YELLOW   APPearance CLEAR CLEAR   Specific Gravity, Urine 1.043 (H) 1.005 - 1.030   pH 7.0 5.0 - 8.0   Glucose, UA NEGATIVE NEGATIVE mg/dL   Hgb urine dipstick SMALL (A) NEGATIVE   Bilirubin Urine NEGATIVE NEGATIVE   Ketones, ur 20 (A) NEGATIVE mg/dL   Protein, ur 30 (A) NEGATIVE mg/dL   Nitrite NEGATIVE NEGATIVE   Leukocytes,Ua NEGATIVE NEGATIVE   RBC / HPF 0-5 0 - 5 RBC/hpf   WBC, UA 0-5 0 - 5 WBC/hpf   Bacteria, UA NONE SEEN NONE SEEN    Comment: Performed at [facility], [address] [place], [place] [address]  Resp Panel by RT-PCR (Flu A&B, Covid) Anterior Nasal Swab     Status: None    Collection Time: 03/20/22  3:20 PM   Specimen: Anterior Nasal Swab  Result Value Ref Range   SARS Coronavirus 2 by RT PCR NEGATIVE NEGATIVE    Comment: (NOTE) SARS-CoV-2 target nucleic acids are NOT DETECTED.  The SARS-CoV-2 RNA is generally detectable in upper respiratory specimens during the acute phase of infection. The lowest concentration of SARS-CoV-2 viral copies this assay can detect is 138 copies/mL. A negative result does not preclude SARS-Cov-2 infection and should not be used as the sole basis for treatment or other patient management decisions. A negative result may occur with  improper specimen collection/handling, submission of specimen other than nasopharyngeal swab, presence of viral mutation(s) within the areas targeted by this assay, and inadequate number of viral copies(<138 copies/mL). A negative result must be combined with clinical observations, patient history, and epidemiological information. The expected result is Negative.  Fact Sheet for Patients:  https://www.fda.gov/media/152166/download  Fact Sheet for Healthcare Providers:  https://www.fda.gov/media/152162/download  This test is no t yet approved or cleared by the [place] FDA and  has been authorized for detection and/or diagnosis of SARS-CoV-2 by FDA under an Emergency Use Authorization (EUA). This EUA will remain  in effect (meaning this test can be used) for the duration of the COVID-19 declaration under Section 564(b)(1) of the Act, 21 U.S.C.section 360bbb-3(b)(1), unless   the authorization is terminated  or revoked sooner.       Influenza A by PCR NEGATIVE NEGATIVE   Influenza B by PCR NEGATIVE NEGATIVE    Comment: (NOTE) The Xpert Xpress SARS-CoV-2/FLU/RSV plus assay is intended as an aid in the diagnosis of influenza from Nasopharyngeal swab specimens and should not be used as a sole basis for treatment. Nasal washings and aspirates are unacceptable for Xpert Xpress  SARS-CoV-2/FLU/RSV testing.  Fact Sheet for Patients: https://www.fda.gov/media/152166/download  Fact Sheet for Healthcare Providers: https://www.fda.gov/media/152162/download  This test is not yet approved or cleared by the [place] FDA and has been authorized for detection and/or diagnosis of SARS-CoV-2 by FDA under an Emergency Use Authorization (EUA). This EUA will remain in effect (meaning this test can be used) for the duration of the COVID-19 declaration under Section 564(b)(1) of the Act, 21 U.S.C. section 360bbb-3(b)(1), unless the authorization is terminated or revoked.  Performed at [facility], [address] [place], [place] [address]   Comprehensive metabolic panel     Status: Abnormal   Collection Time: 03/20/22  3:23 PM  Result Value Ref Range   Sodium 141 135 - 145 mmol/L   Potassium 4.0 3.5 - 5.1 mmol/L   Chloride 100 98 - 111 mmol/L   CO2 34 (H) 22 - 32 mmol/L   Glucose, Bld 109 (H) 70 - 99 mg/dL    Comment: Glucose reference range applies only to samples taken after fasting for at least 8 hours.   BUN 27 (H) 8 - 23 mg/dL   Creatinine, Ser 1.17 (H) 0.44 - 1.00 mg/dL   Calcium 8.6 (L) 8.9 - 10.3 mg/dL   Total Protein 5.9 (L) 6.5 - 8.1 g/dL   Albumin 3.2 (L) 3.5 - 5.0 g/dL   AST 399 (H) 15 - 41 U/L   ALT 211 (H) 0 - 44 U/L   Alkaline Phosphatase 84 38 - 126 U/L   Total Bilirubin 1.1 0.3 - 1.2 mg/dL   GFR, Estimated 51 (L) >60 mL/min    Comment: (NOTE) Calculated using the CKD-EPI Creatinine Equation (2021)    Anion gap 7 5 - 15    Comment: Performed at [facility], [address] [place], [place] [address]  CBC     Status: Abnormal   Collection Time: 03/20/22  3:23 PM  Result Value Ref Range   WBC 6.9 4.0 - 10.5 K/uL   RBC 3.61 (L) 3.87 - 5.11 MIL/uL   Hemoglobin 12.5 12.0 - 15.0 g/dL   HCT 39.3 36.0 - 46.0 %   MCV 108.9 (H) 80.0 - 100.0 fL   MCH 34.6 (H) 26.0 - 34.0 pg   MCHC 31.8 30.0 - 36.0 g/dL   RDW 15.2 11.5 - 15.5 %   Platelets 114  (L) 150 - 400 K/uL   nRBC 0.4 (H) 0.0 - 0.2 %    Comment: Performed at [facility], [address] [place], [place] [address]  Lactic acid, plasma     Status: None   Collection Time: 03/20/22  3:23 PM  Result Value Ref Range   Lactic Acid, Venous 1.5 0.5 - 1.9 mmol/L    Comment: Performed at [facility], [address] [place], [place] [address]  Protime-INR     Status: Abnormal   Collection Time: 03/20/22  3:23 PM  Result Value Ref Range   Prothrombin Time 19.7 (H) 11.4 - 15.2 seconds   INR 1.7 (H) 0.8 - 1.2    Comment: (NOTE) INR goal varies based   on device and disease states. Performed at [facility], [address] [place], [place] [address]   Sample to Blood Bank     Status: None   Collection Time: 03/20/22  3:23 PM  Result Value Ref Range   Blood Bank Specimen SAMPLE AVAILABLE FOR TESTING    Sample Expiration      03/21/2022,2359 Performed at [facility], [address] [place], [place] [address]   CK     Status: None   Collection Time: 03/20/22  3:23 PM  Result Value Ref Range   Total CK 149 38 - 234 U/L    Comment: Performed at [facility], [address] [place], [place] [address]  Ammonia     Status: None   Collection Time: 03/20/22  3:23 PM  Result Value Ref Range   Ammonia 12 9 - 35 umol/L    Comment: Performed at [facility], [address] [place], [place] [address]  Acetaminophen level     Status: Abnormal   Collection Time: 03/20/22  3:34 PM  Result Value Ref Range   Acetaminophen (Tylenol), Serum <10 (L) 10 - 30 ug/mL    Comment: (NOTE) Therapeutic concentrations vary significantly. A range of 10-30 ug/mL  may be an effective concentration for many patients. However, some  are best treated at concentrations outside of this range. Acetaminophen concentrations >150 ug/mL at 4 hours after ingestion  and >50 ug/mL at 12 hours after ingestion are often associated with  toxic reactions.  Performed at [facility], [address] [place], [place]  [address]   Salicylate level     Status: Abnormal   Collection Time: 03/20/22  3:34 PM  Result Value Ref Range   Salicylate Lvl <7.0 (L) 7.0 - 30.0 mg/dL    Comment: Performed at [facility], [address] [place], [place] [address]  Ethanol     Status: None   Collection Time: 03/20/22  3:34 PM  Result Value Ref Range   Alcohol, Ethyl (B) <10 <10 mg/dL    Comment: (NOTE) Lowest detectable limit for serum alcohol is 10 mg/dL.  For medical purposes only. Performed at [facility], [address] [place], [place] [address]   Blood gas, venous     Status: Abnormal   Collection Time: 03/20/22  4:08 PM  Result Value Ref Range   pH, Ven 7.36 7.25 - 7.43   pCO2, Ven 72 (HH) 44 - 60 mmHg    Comment: CRITICAL RESULT CALLED TO, READ BACK BY AND VERIFIED WITH:  B. BANDS @ 1638 BY STEPHTR 03/20/22    pO2, Ven <31 (LL) 32 - 45 mmHg    Comment: CRITICAL RESULT CALLED TO, READ BACK BY AND VERIFIED WITH:  B. BANDS @ 1638 BY STEPHTR 03/20/22    Bicarbonate 40.7 (H) 20.0 - 28.0 mmol/L   Acid-Base Excess 12.0 (H) 0.0 - 2.0 mmol/L   O2 Saturation 14.3 %   Patient temperature 37.1    Collection site LEFT ANTECUBITAL    Drawn by [id number]     Comment: Performed at [facility], [address] [place], [place] [address]  Lipase, blood     Status: None   Collection Time: 03/20/22  5:47 PM  Result Value Ref Range   Lipase 43 11 - 51 U/L    Comment: Performed at [facility], [address] [place],  [address]  Basic metabolic panel     Status: Abnormal   Collection Time: 03/20/22  5:47 PM  Result Value Ref Range     Sodium 141 135 - 145 mmol/L   Potassium 4.0 3.5 - 5.1 mmol/L   Chloride 101 98 - 111 mmol/L   CO2 34 (H) 22 - 32 mmol/L   Glucose, Bld 116 (H) 70 - 99 mg/dL    Comment: Glucose reference range applies only to samples taken after fasting for at least 8 hours.   BUN 27 (H) 8 - 23 mg/dL   Creatinine, Ser 1.09 (H) 0.44 - 1.00 mg/dL   Calcium 8.4 (L) 8.9 - 10.3 mg/dL   GFR, Estimated  56 (L) >60 mL/min    Comment: (NOTE) Calculated using the CKD-EPI Creatinine Equation (2021)    Anion gap 6 5 - 15    Comment: Performed at [facility], [address] [place], [place] [address]  HIV Antibody (routine testing w rflx)     Status: None   Collection Time: 03/20/22  5:47 PM  Result Value Ref Range   HIV Screen 4th Generation wRfx Non Reactive Non Reactive    Comment: Performed at [facility], [address] [place], [place] [address]  Troponin I (High Sensitivity)     Status: Abnormal   Collection Time: 03/20/22  5:47 PM  Result Value Ref Range   Troponin I (High Sensitivity) 240 (HH) <18 ng/L    Comment: CRITICAL RESULT CALLED TO, READ BACK BY AND VERIFIED WITH: NICHOLS,K AT 2111 ON 03/20/2022 BY MOSLEY,J (NOTE) Elevated high sensitivity troponin I (hsTnI) values and significant  changes across serial measurements may suggest ACS but many other  chronic and acute conditions are known to elevate hsTnI results.  Refer to the Links section for chest pain algorithms and additional  guidance. Performed at [facility], [address] [place], [place] [address]   Rapid urine drug screen (hospital performed)     Status: Abnormal   Collection Time: 03/20/22  7:22 PM  Result Value Ref Range   Opiates NONE DETECTED NONE DETECTED   Cocaine NONE DETECTED NONE DETECTED   Benzodiazepines POSITIVE (A) NONE DETECTED   Amphetamines NONE DETECTED NONE DETECTED   Tetrahydrocannabinol NONE DETECTED NONE DETECTED   Barbiturates NONE DETECTED NONE DETECTED    Comment: (NOTE) DRUG SCREEN FOR MEDICAL PURPOSES ONLY.  IF CONFIRMATION IS NEEDED FOR ANY PURPOSE, NOTIFY LAB WITHIN 5 DAYS.  LOWEST DETECTABLE LIMITS FOR URINE DRUG SCREEN Drug Class                     Cutoff (ng/mL) Amphetamine and metabolites    1000 Barbiturate and metabolites    200 Benzodiazepine                 200 Tricyclics and metabolites     300 Opiates and metabolites        300 Cocaine and  metabolites        300 THC                            50 Performed at [facility], [address] [place], [place] [address]   Troponin I (High Sensitivity)     Status: Abnormal   Collection Time: 03/20/22 10:26 PM  Result Value Ref Range   Troponin I (High Sensitivity) 208 (HH) <18 ng/L    Comment: CRITICAL RESULT CALLED TO, READ BACK BY AND VERIFIED WITH: NICKOLS,K @ 2315 ON 03/20/22 BY JUW Performed at [facility], [address] [place], [place] [address]   Blood gas, arterial       Status: Abnormal   Collection Time: 03/21/22 12:52 AM  Result Value Ref Range   FIO2 40.0 %   pH, Arterial 7.41 7.35 - 7.45   pCO2 arterial 57 (H) 32 - 48 mmHg   pO2, Arterial 74 (L) 83 - 108 mmHg   Bicarbonate 36.1 (H) 20.0 - 28.0 mmol/L   Acid-Base Excess 9.4 (H) 0.0 - 2.0 mmol/L   O2 Saturation 95.2 %   Patient temperature 37.0    Collection site LEFT BRACHIAL    Drawn by [id number]    Allens test (pass/fail) PASS PASS    Comment: Performed at [facility], [address] [place], [place] [address]  Comprehensive metabolic panel     Status: Abnormal   Collection Time: 03/21/22  4:46 AM  Result Value Ref Range   Sodium 138 135 - 145 mmol/L   Potassium 3.6 3.5 - 5.1 mmol/L   Chloride 99 98 - 111 mmol/L   CO2 33 (H) 22 - 32 mmol/L   Glucose, Bld 97 70 - 99 mg/dL    Comment: Glucose reference range applies only to samples taken after fasting for at least 8 hours.   BUN 22 8 - 23 mg/dL   Creatinine, Ser 0.94 0.44 - 1.00 mg/dL   Calcium 8.4 (L) 8.9 - 10.3 mg/dL   Total Protein 5.7 (L) 6.5 - 8.1 g/dL   Albumin 3.1 (L) 3.5 - 5.0 g/dL   AST 283 (H) 15 - 41 U/L   ALT 197 (H) 0 - 44 U/L   Alkaline Phosphatase 83 38 - 126 U/L   Total Bilirubin 1.7 (H) 0.3 - 1.2 mg/dL   GFR, Estimated >60 >60 mL/min    Comment: (NOTE) Calculated using the CKD-EPI Creatinine Equation (2021)    Anion gap 6 5 - 15    Comment: Performed at [facility], [address] [place], [place] [address]  Magnesium     Status:  None   Collection Time: 03/21/22  4:46 AM  Result Value Ref Range   Magnesium 1.9 1.7 - 2.4 mg/dL    Comment: Performed at [facility], [address] [place], [place] [address]  CBC with Differential/Platelet     Status: Abnormal   Collection Time: 03/21/22  4:46 AM  Result Value Ref Range   WBC 8.5 4.0 - 10.5 K/uL   RBC 3.60 (L) 3.87 - 5.11 MIL/uL   Hemoglobin 12.5 12.0 - 15.0 g/dL   HCT 38.7 36.0 - 46.0 %   MCV 107.5 (H) 80.0 - 100.0 fL   MCH 34.7 (H) 26.0 - 34.0 pg   MCHC 32.3 30.0 - 36.0 g/dL   RDW 15.0 11.5 - 15.5 %   Platelets 106 (L) 150 - 400 K/uL    Comment: Immature Platelet Fraction may be clinically indicated, consider ordering this additional test [id number]    nRBC 0.0 0.0 - 0.2 %   Neutrophils Relative % 78 %   Neutro Abs 6.7 1.7 - 7.7 K/uL   Lymphocytes Relative 13 %   Lymphs Abs 1.1 0.7 - 4.0 K/uL   Monocytes Relative 8 %   Monocytes Absolute 0.7 0.1 - 1.0 K/uL   Eosinophils Relative 0 %   Eosinophils Absolute 0.0 0.0 - 0.5 K/uL   Basophils Relative 0 %   Basophils Absolute 0.0 0.0 - 0.1 K/uL   Immature Granulocytes 1 %   Abs Immature Granulocytes 0.05 0.00 - 0.07 K/uL    Comment: Performed at [facility], [address] , [place] [address]    Troponin I (High Sensitivity)     Status: Abnormal   Collection Time: 03/21/22  4:46 AM  Result Value Ref Range   Troponin I (High Sensitivity) 263 (HH) <18 ng/L    Comment: CRITICAL RESULT CALLED TO, READ BACK BY AND VERIFIED WITH: NICHOLS,K AT 6:30AM ON 03/21/22 BY FESTERMAN,C (NOTE) Elevated high sensitivity troponin I (hsTnI) values and significant  changes across serial measurements may suggest ACS but many other  chronic and acute conditions are known to elevate hsTnI results.  Refer to the Links section for chest pain algorithms and additional  guidance. Performed at [facility], [address] [place], [place] [address]   TSH     Status: None   Collection Time: 03/21/22  4:47 AM  Result Value  Ref Range   TSH 2.546 0.350 - 4.500 uIU/mL    Comment: Performed by a 3rd Generation assay with a functional sensitivity of <=0.01 uIU/mL. Performed at [facility], [address] [place], [place] [address]     DG CHEST PORT 1 VIEW  Result Date: 03/22/2022 CLINICAL DATA:  Multiple rib fractures EXAM: PORTABLE CHEST 1 VIEW COMPARISON:  Portable exam 0813 hours compared to 03/20/2022 FINDINGS: Upper normal heart size. Mediastinal contours and pulmonary vascularity normal. Atherosclerotic calcification aorta. Emphysematous changes with BILATERAL lower lobe atelectasis and small RIGHT pleural effusion. No definite infiltrate or pneumothorax. Diffuse osseous demineralization. Known LEFT rib fractures by recent CT are inadequately demonstrated radiographically. IMPRESSION: COPD changes with bibasilar atelectasis and small RIGHT pleural effusion. Aortic Atherosclerosis (ICD10-I70.0) and Emphysema (ICD10-J43.9). Electronically Signed   By: Mark  Boles M.D.   On: 03/22/2022 08:42   ECHOCARDIOGRAM COMPLETE  Result Date: 03/21/2022    ECHOCARDIOGRAM REPORT   Patient Name:   Alyene F Dresden Date of Exam: 03/21/2022 Medical Rec #:  [id number]      Height:       60.0 in Accession #:    [id number]     Weight:       115.0 lb Date of Birth:  03/23/1956      BSA:          1.475 m Patient Age:    65 years       BP:           140/80 mmHg Patient Gender: F              HR:           96 bpm. Exam Location:   Procedure: 2D Echo, Cardiac Doppler and Color Doppler Indications:    R06.02 SOB. Acute respiratory distress.  History:        Patient has no prior history of Echocardiogram examinations.                 Signs/Symptoms:Shortness of Breath and Dyspnea; Risk                 Factors:Current Smoker. Elevated troponin.  Sonographer:    Tina West RDCS Referring Phys: [id number] ASIA B ZIERLE-GHOSH  Sonographer Comments: Technically difficult study due to poor echo windows, suboptimal parasternal window, suboptimal apical  window and suboptimal subcostal window. Patient moving constantly during exam. Unable to obtain on- axis images. Patient could not move due to broken leg. Attempted to move from right decubitus position to sight left decubitus. Patient moaning during test. IMPRESSIONS  1. Limited windows and study. Left ventricular ejection fraction, by estimation, is 60 to 65%. The left ventricle has normal function. The left ventricle has no regional   wall motion abnormalities. Left ventricular diastolic parameters are indeterminate.  2. Right ventricular systolic function is normal. The right ventricular size is not well visualized. Tricuspid regurgitation signal is inadequate for assessing PA pressure.  3. Moderate pleural effusion.  4. Mild mitral valve regurgitation.  5. The aortic valve was not well visualized. Aortic valve regurgitation is not visualized.  6. The inferior vena cava is dilated in size with <50% respiratory variability, suggesting right atrial pressure of 15 mmHg. Comparison(s): No prior Echocardiogram. Conclusion(s)/Recommendation(s): Normal biventricular function without evidence of hemodynamically significant valvular heart disease. FINDINGS  Left Ventricle: Limited windows and study. Left ventricular ejection fraction, by estimation, is 60 to 65%. The left ventricle has normal function. The left ventricle has no regional wall motion abnormalities. The left ventricular internal cavity size was normal in size. There is no left ventricular hypertrophy. Left ventricular diastolic parameters are indeterminate. Right Ventricle: The right ventricular size is not well visualized. Right ventricular systolic function is normal. Tricuspid regurgitation signal is inadequate for assessing PA pressure. Left Atrium: Left atrial size was normal in size. Right Atrium: Right atrial size was normal in size. Pericardium: There is no evidence of pericardial effusion. Mitral Valve: Mild mitral valve regurgitation. Tricuspid  Valve: Tricuspid valve regurgitation is not demonstrated. Aortic Valve: The aortic valve was not well visualized. Aortic valve regurgitation is not visualized. Pulmonic Valve: The pulmonic valve was grossly normal. Pulmonic valve regurgitation is not visualized. Aorta: The aortic root and ascending aorta are structurally normal, with no evidence of dilitation. Venous: The inferior vena cava is dilated in size with less than 50% respiratory variability, suggesting right atrial pressure of 15 mmHg. IAS/Shunts: The interatrial septum was not well visualized. Additional Comments: There is a moderate pleural effusion.  LEFT VENTRICLE PLAX 2D LVIDd:         3.40 cm     Diastology LVIDs:         2.20 cm     LV e' medial:    5.03 cm/s LV PW:         0.90 cm     LV E/e' medial:  17.6 LV IVS:        0.90 cm     LV e' lateral:   4.46 cm/s LVOT diam:     1.80 cm     LV E/e' lateral: 19.9 LV SV:         28 LV SV Index:   19 LVOT Area:     2.54 cm  LV Volumes (MOD) LV vol d, MOD A4C: 34.0 ml LV vol s, MOD A4C: 11.5 ml LV SV MOD A4C:     34.0 ml RIGHT VENTRICLE            IVC RV S prime:     7.14 cm/s  IVC diam: 2.60 cm LEFT ATRIUM           Index        RIGHT ATRIUM           Index LA diam:      2.90 cm 1.97 cm/m   RA Area:     10.10 cm LA Vol (A4C): 19.6 ml 13.28 ml/m  RA Volume:   19.50 ml  13.22 ml/m  AORTIC VALVE             PULMONIC VALVE LVOT Vmax:   61.70 cm/s  PR End Diast Vel: 1.77 msec LVOT Vmean:  40.400 cm/s LVOT VTI:    0.111 m  AORTA Ao   Root diam: 2.60 cm Ao Asc diam:  2.90 cm MITRAL VALVE MV Area (PHT): 5.02 cm    SHUNTS MV Decel Time: 151 msec    Systemic VTI:  0.11 m MV E velocity: 88.60 cm/s  Systemic Diam: 1.80 cm MV A velocity: 60.90 cm/s MV E/A ratio:  1.45 Mary Branch Electronically signed by Mary Branch Signature Date/Time: 03/21/2022/12:19:34 PM    Final    US Abdomen Limited RUQ (LIVER/GB)  Result Date: 03/21/2022 CLINICAL DATA:  Liver failure EXAM: ULTRASOUND ABDOMEN LIMITED RIGHT UPPER QUADRANT  COMPARISON:  CT abdomen pelvis 03/20/2022 FINDINGS: Gallbladder: Gallstones: None Sludge: None Gallbladder Wall: Within normal limits Pericholecystic fluid: None Sonographic Murphy's Sign: Negative per technologist Common bile duct: Diameter: 3 mm Liver: Parenchymal echogenicity: Within normal limits Contours: Normal Lesions: None Portal vein: Patent.  Hepatopetal flow Other: Trace perihepatic ascites. Minimal right pleural effusion partially visualized. IMPRESSION: 1. No significant sonographic abnormality of the gallbladder or liver. 2. Trace perihepatic ascites and minimal right pleural effusion. Electronically Signed   By: Farhaan  Mir M.D.   On: 03/21/2022 09:19   DG Tibia/Fibula Right  Result Date: 03/20/2022 CLINICAL DATA:  Known tibial and fibular fractures following reduction, initial encounter EXAM: RIGHT TIBIA AND FIBULA - 2 VIEW COMPARISON:  Film from earlier in the same day. FINDINGS: Casting material is now noted in place. Previously seen tibial fracture has been reduced somewhat. Proximal fibular fracture is again noted and stable. No new focal abnormality is noted. IMPRESSION: Slight reduction of tibial fracture site. Casting material is noted. Electronically Signed   By: Mark  Lukens M.D.   On: 03/20/2022 21:40   CT CHEST ABDOMEN PELVIS W CONTRAST  Result Date: 03/20/2022 CLINICAL DATA:  Fall, found on floor EXAM: CT CHEST, ABDOMEN, AND PELVIS WITH CONTRAST TECHNIQUE: Multidetector CT imaging of the chest, abdomen and pelvis was performed following the standard protocol during bolus administration of intravenous contrast. RADIATION DOSE REDUCTION: This exam was performed according to the departmental dose-optimization program which includes automated exposure control, adjustment of the mA and/or kV according to patient size and/or use of iterative reconstruction technique. CONTRAST:  100mL OMNIPAQUE IOHEXOL 300 MG/ML  SOLN COMPARISON:  CT abdomen pelvis, 03/18/2010 FINDINGS: CT CHEST  FINDINGS Cardiovascular: Aortic atherosclerosis. Normal heart size. No pericardial effusion. Mediastinum/Nodes: No enlarged mediastinal, hilar, or axillary lymph nodes. Thyroid gland, trachea, and esophagus demonstrate no significant findings. Lungs/Pleura: Severe emphysema. Mild, diffuse bilateral bronchial wall thickening. Interlobular septal thickening. Small bilateral pleural effusions and associated atelectasis or consolidation. Musculoskeletal: No chest wall abnormality. Mildly displaced, acute fractures of the lateral and posterior left seventh through twelfth ribs. CT ABDOMEN PELVIS FINDINGS Hepatobiliary: No solid liver abnormality is seen. No gallstones, gallbladder wall thickening, or biliary dilatation. Pancreas: Unremarkable. No pancreatic ductal dilatation or surrounding inflammatory changes. Spleen: Normal in size without significant abnormality. Adrenals/Urinary Tract: Adrenal glands are unremarkable. 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 The left kidney is normal, without renal calculi, solid lesion, or hydronephrosis. Bladder is unremarkable. Stomach/Bowel: Stomach is within normal limits. Appendix appears normal. No evidence of bowel wall thickening, distention, or inflammatory changes. Vascular/Lymphatic: Aortic atherosclerosis. No enlarged abdominal or pelvic lymph nodes. Reproductive: No mass or other abnormality. Other: No abdominal wall hernia or abnormality. Small volume perihepatic ascites. Musculoskeletal: No acute osseous findings. IMPRESSION: 1. Mildly displaced, acute fractures of the lateral and posterior left seventh through twelfth ribs. 2. Small bilateral pleural effusions and associated atelectasis or consolidation. No associated pneumothorax. 3. Diffuse bilateral bronchial wall   thickening and interlobular septal thickening, most consistent with pulmonary edema. 4. Severe emphysema. 5. Small volume simple fluid attenuation perihepatic ascites. No direct CT  evidence of abdominal or pelvic organ injury. 6. Solitary left kidney. Aortic Atherosclerosis (ICD10-I70.0) and Emphysema (ICD10-J43.9). Electronically Signed   By: Alex D Bibbey M.D.   On: 03/20/2022 17:32   CT HEAD WO CONTRAST  Result Date: 03/20/2022 CLINICAL DATA:  Head trauma, moderate-severe; Polytrauma, blunt. Fall EXAM: CT HEAD WITHOUT CONTRAST CT CERVICAL SPINE WITHOUT CONTRAST TECHNIQUE: Multidetector CT imaging of the head and cervical spine was performed following the standard protocol without intravenous contrast. Multiplanar CT image reconstructions of the cervical spine were also generated. RADIATION DOSE REDUCTION: This exam was performed according to the departmental dose-optimization program which includes automated exposure control, adjustment of the mA and/or kV according to patient size and/or use of iterative reconstruction technique. COMPARISON:  None Available. FINDINGS: CT HEAD FINDINGS Brain: No evidence of large-territorial acute infarction. No parenchymal hemorrhage. No mass lesion. No extra-axial collection. No mass effect or midline shift. No hydrocephalus. Basilar cisterns are patent. Vascular: No hyperdense vessel. Atherosclerotic calcifications are present within the cavernous internal carotid arteries. Skull: No acute fracture or focal lesion. Sinuses/Orbits: Paranasal sinuses and mastoid air cells are clear. Bilateral lens replacement. Otherwise the orbits are unremarkable. Other: None. CT CERVICAL SPINE FINDINGS Alignment: Normal. Skull base and vertebrae: No acute fracture. No aggressive appearing focal osseous lesion or focal pathologic process. Soft tissues and spinal canal: No prevertebral fluid or swelling. No visible canal hematoma. Upper chest: Emphysematous changes. Interlobular septal wall thickening. Other: None. IMPRESSION: 1. No acute intracranial abnormality. 2. No acute displaced fracture or traumatic listhesis of the cervical spine. 3.  Emphysema (ICD10-J43.9).  4. Possible pulmonary edema. Please see separately dictated CT chest 03/20/2022. Electronically Signed   By: Morgane  Naveau M.D.   On: 03/20/2022 17:14   CT CERVICAL SPINE WO CONTRAST  Result Date: 03/20/2022 CLINICAL DATA:  Head trauma, moderate-severe; Polytrauma, blunt. Fall EXAM: CT HEAD WITHOUT CONTRAST CT CERVICAL SPINE WITHOUT CONTRAST TECHNIQUE: Multidetector CT imaging of the head and cervical spine was performed following the standard protocol without intravenous contrast. Multiplanar CT image reconstructions of the cervical spine were also generated. RADIATION DOSE REDUCTION: This exam was performed according to the departmental dose-optimization program which includes automated exposure control, adjustment of the mA and/or kV according to patient size and/or use of iterative reconstruction technique. COMPARISON:  None Available. FINDINGS: CT HEAD FINDINGS Brain: No evidence of large-territorial acute infarction. No parenchymal hemorrhage. No mass lesion. No extra-axial collection. No mass effect or midline shift. No hydrocephalus. Basilar cisterns are patent. Vascular: No hyperdense vessel. Atherosclerotic calcifications are present within the cavernous internal carotid arteries. Skull: No acute fracture or focal lesion. Sinuses/Orbits: Paranasal sinuses and mastoid air cells are clear. Bilateral lens replacement. Otherwise the orbits are unremarkable. Other: None. CT CERVICAL SPINE FINDINGS Alignment: Normal. Skull base and vertebrae: No acute fracture. No aggressive appearing focal osseous lesion or focal pathologic process. Soft tissues and spinal canal: No prevertebral fluid or swelling. No visible canal hematoma. Upper chest: Emphysematous changes. Interlobular septal wall thickening. Other: None. IMPRESSION: 1. No acute intracranial abnormality. 2. No acute displaced fracture or traumatic listhesis of the cervical spine. 3.  Emphysema (ICD10-J43.9). 4. Possible pulmonary edema. Please see  separately dictated CT chest 03/20/2022. Electronically Signed   By: Morgane  Naveau M.D.   On: 03/20/2022 17:14   DG Pelvis Portable  Result Date: 03/20/2022 CLINICAL DATA:  Trauma EXAM: PORTABLE   PELVIS 1-2 VIEWS COMPARISON:  March 09, 2010 FINDINGS: Evaluation is limited by technique. No pelvic diastasis. Degenerative changes of bilateral hips. Limited assessment of the sacrum secondary to overlapping bowel contents. No definitive acute displaced fracture is visualized. IMPRESSION: Limited evaluation due to technique. No definitive acute displaced fracture is visualized. If persistent clinical concern, recommend dedicated cross-sectional imaging or additional radiographic views. Electronically Signed   By: Stephanie  Peacock M.D.   On: 03/20/2022 16:02   DG Chest Port 1 View  Result Date: 03/20/2022 CLINICAL DATA:  Trauma EXAM: PORTABLE CHEST 1 VIEW COMPARISON:  March 18, 2010 FINDINGS: The cardiomediastinal silhouette is enlarged in contour, increased since 2011.Atherosclerotic calcifications. No pleural effusion. No pneumothorax. Diffuse coarse interstitial opacities. Questionable nodular opacity at the RIGHT apex. There are several age indeterminate LEFT-sided rib fractures of the approximate fifth and sixth ribs. IMPRESSION: 1. Age indeterminate LEFT-sided rib fractures. Recommend correlation with point tenderness. No pneumothorax is identified. 2. Questionable RIGHT apical nodular opacity versus summation artifact. Consider PA and lateral chest radiograph versus dedicated CT scan for improved evaluation. 3. Increased cardiomegaly in comparison to prior from 2011. 4. Diffuse coarse reticulation may reflect a degree of underlying interstitial lung disease or pulmonary emphysema. Electronically Signed   By: Stephanie  Peacock M.D.   On: 03/20/2022 16:01   DG Tibia/Fibula Right Port  Result Date: 03/20/2022 CLINICAL DATA:  Blunt Trauma EXAM: PORTABLE RIGHT TIBIA AND FIBULA - 2 VIEW COMPARISON:   None Available. FINDINGS: Osteopenia. There is an oblique fracture of the distal tibial shaft with minimal lateral displacement of the distal fragment. Nondisplaced component extends inferiorly. There is a minimally displaced fracture of the fibular head. No unexpected radiopaque foreign body. Soft tissue edema. IMPRESSION: Minimally displaced oblique fracture of the distal tibial shaft and a minimally displaced fracture of the fibular head. Electronically Signed   By: Stephanie  Peacock M.D.   On: 03/20/2022 15:57    Review of Systems  HENT:  Negative for ear discharge, ear pain, hearing loss and tinnitus.   Eyes:  Negative for photophobia and pain.  Respiratory:  Negative for cough and shortness of breath.   Cardiovascular:  Negative for chest pain.  Gastrointestinal:  Negative for abdominal pain, nausea and vomiting.  Genitourinary:  Negative for dysuria, flank pain, frequency and urgency.  Musculoskeletal:  Positive for arthralgias (Right lower leg). Negative for back pain, myalgias and neck pain.  Neurological:  Negative for dizziness and headaches.  Hematological:  Does not bruise/bleed easily.  Psychiatric/Behavioral:  The patient is not nervous/anxious.    Blood pressure (!) 109/56, pulse 95, temperature 97.9 F (36.6 C), temperature source Oral, resp. rate 13, weight 38.7 kg, SpO2 100 %. Physical Exam Constitutional:      General: She is not in acute distress.    Appearance: She is well-developed. She is not diaphoretic.  HENT:     Head: Normocephalic and atraumatic.  Eyes:     General: No scleral icterus.       Right eye: No discharge.        Left eye: No discharge.     Conjunctiva/sclera: Conjunctivae normal.  Cardiovascular:     Rate and Rhythm: Normal rate and regular rhythm.  Pulmonary:     Effort: Pulmonary effort is normal. No respiratory distress.  Musculoskeletal:     Cervical back: Normal range of motion.     Comments: RLE No traumatic wounds, ecchymosis, or  rash  Short leg splint in place  No knee effusion  Knee stable   to varus/ valgus and anterior/posterior stress  Sens DPN, SPN, TN intact  Motor EHL 5/5  Toes perfused, No significant edema  Skin:    General: Skin is warm and dry.  Neurological:     Mental Status: She is alert.  Psychiatric:        Mood and Affect: Mood normal.        Behavior: Behavior normal.     Assessment/Plan: Right tibia fx -- Will tentatively plan IMN tomorrow with Dr. Haddix based on medical suitability. Please keep NPO after MN. Multiple medical problems including opiate abuse, anxiety, arthritis, and heavy smoking -- per primary service    Hansel Devan J. Janeane Cozart, PA-C Orthopedic Surgery [phone] 03/22/2022, 10:02 AM  

## 2022-03-22 NOTE — H&P (View-Only) (Signed)
Reason for Consult:Right tibia fx Referring Physician: Hughie Closs Time called: 1610 Time at bedside: 0956   Valerie Bradley is an 66 y.o. female.  HPI: Sagal fell at home unwitnessed. She does not remember the details of the fall. She was found down delirious and brought to [facility]. Workup showed a right tibia fx and orthopedic surgery was consulted. She was also quite hypoxic and was transferred to [facility] for further care. Her delirium has cleared. She lives alone and does not use any assistive devices to ambulate.  Past Medical History:  Diagnosis Date   Arthritis     Past Surgical History:  Procedure Laterality Date   BACK SURGERY     TUBAL LIGATION      No family history on file.  Social History:  reports that she has been smoking. She has been smoking an average of .5 packs per day. She does not have any smokeless tobacco history on file. She reports current alcohol use. No history on file for drug use.  Allergies: No Known Allergies  Medications: I have reviewed the patient's current medications.  Results for orders placed or performed during the hospital encounter of 03/20/22 (from the past 48 hour(s))  Urinalysis, Routine w reflex microscopic Urine, Clean Catch     Status: Abnormal   Collection Time: 03/20/22  2:56 PM  Result Value Ref Range   Color, Urine YELLOW YELLOW   APPearance CLEAR CLEAR   Specific Gravity, Urine 1.043 (H) 1.005 - 1.030   pH 7.0 5.0 - 8.0   Glucose, UA NEGATIVE NEGATIVE mg/dL   Hgb urine dipstick SMALL (A) NEGATIVE   Bilirubin Urine NEGATIVE NEGATIVE   Ketones, ur 20 (A) NEGATIVE mg/dL   Protein, ur 30 (A) NEGATIVE mg/dL   Nitrite NEGATIVE NEGATIVE   Leukocytes,Ua NEGATIVE NEGATIVE   RBC / HPF 0-5 0 - 5 RBC/hpf   WBC, UA 0-5 0 - 5 WBC/hpf   Bacteria, UA NONE SEEN NONE SEEN    Comment: Performed at [facility], [address], [place], [place] [address]  Resp Panel by RT-PCR (Flu A&B, Covid) Anterior Nasal Swab     Status: None    Collection Time: 03/20/22  3:20 PM   Specimen: Anterior Nasal Swab  Result Value Ref Range   SARS Coronavirus 2 by RT PCR NEGATIVE NEGATIVE    Comment: (NOTE) SARS-CoV-2 target nucleic acids are NOT DETECTED.  The SARS-CoV-2 RNA is generally detectable in upper respiratory specimens during the acute phase of infection. The lowest concentration of SARS-CoV-2 viral copies this assay can detect is 138 copies/mL. A negative result does not preclude SARS-Cov-2 infection and should not be used as the sole basis for treatment or other patient management decisions. A negative result may occur with  improper specimen collection/handling, submission of specimen other than nasopharyngeal swab, presence of viral mutation(s) within the areas targeted by this assay, and inadequate number of viral copies(<138 copies/mL). A negative result must be combined with clinical observations, patient history, and epidemiological information. The expected result is Negative.  Fact Sheet for Patients:  BloggerCourse.com  Fact Sheet for Healthcare Providers:  SeriousBroker.it  This test is no t yet approved or cleared by the [place] FDA and  has been authorized for detection and/or diagnosis of SARS-CoV-2 by FDA under an Emergency Use Authorization (EUA). This EUA will remain  in effect (meaning this test can be used) for the duration of the COVID-19 declaration under Section 564(b)(1) of the Act, 21 U.S.C.section 360bbb-3(b)(1), unless  the authorization is terminated  or revoked sooner.       Influenza A by PCR NEGATIVE NEGATIVE   Influenza B by PCR NEGATIVE NEGATIVE    Comment: (NOTE) The Xpert Xpress SARS-CoV-2/FLU/RSV plus assay is intended as an aid in the diagnosis of influenza from Nasopharyngeal swab specimens and should not be used as a sole basis for treatment. Nasal washings and aspirates are unacceptable for Xpert Xpress  SARS-CoV-2/FLU/RSV testing.  Fact Sheet for Patients: BloggerCourse.com  Fact Sheet for Healthcare Providers: SeriousBroker.it  This test is not yet approved or cleared by the [place] FDA and has been authorized for detection and/or diagnosis of SARS-CoV-2 by FDA under an Emergency Use Authorization (EUA). This EUA will remain in effect (meaning this test can be used) for the duration of the COVID-19 declaration under Section 564(b)(1) of the Act, 21 U.S.C. section 360bbb-3(b)(1), unless the authorization is terminated or revoked.  Performed at [facility], [address], [place], [place] [address]   Comprehensive metabolic panel     Status: Abnormal   Collection Time: 03/20/22  3:23 PM  Result Value Ref Range   Sodium 141 135 - 145 mmol/L   Potassium 4.0 3.5 - 5.1 mmol/L   Chloride 100 98 - 111 mmol/L   CO2 34 (H) 22 - 32 mmol/L   Glucose, Bld 109 (H) 70 - 99 mg/dL    Comment: Glucose reference range applies only to samples taken after fasting for at least 8 hours.   BUN 27 (H) 8 - 23 mg/dL   Creatinine, Ser 8.46 (H) 0.44 - 1.00 mg/dL   Calcium 8.6 (L) 8.9 - 10.3 mg/dL   Total Protein 5.9 (L) 6.5 - 8.1 g/dL   Albumin 3.2 (L) 3.5 - 5.0 g/dL   AST 962 (H) 15 - 41 U/L   ALT 211 (H) 0 - 44 U/L   Alkaline Phosphatase 84 38 - 126 U/L   Total Bilirubin 1.1 0.3 - 1.2 mg/dL   GFR, Estimated 51 (L) >60 mL/min    Comment: (NOTE) Calculated using the CKD-EPI Creatinine Equation (2021)    Anion gap 7 5 - 15    Comment: Performed at [facility], [address] [place], [place] [address]  CBC     Status: Abnormal   Collection Time: 03/20/22  3:23 PM  Result Value Ref Range   WBC 6.9 4.0 - 10.5 K/uL   RBC 3.61 (L) 3.87 - 5.11 MIL/uL   Hemoglobin 12.5 12.0 - 15.0 g/dL   HCT 13.2 44.0 - 10.2 %   MCV 108.9 (H) 80.0 - 100.0 fL   MCH 34.6 (H) 26.0 - 34.0 pg   MCHC 31.8 30.0 - 36.0 g/dL   RDW 72.5 36.6 - 44.0 %   Platelets 114  (L) 150 - 400 K/uL   nRBC 0.4 (H) 0.0 - 0.2 %    Comment: Performed at [facility], [address], [place], [place] [address]  Lactic acid, plasma     Status: None   Collection Time: 03/20/22  3:23 PM  Result Value Ref Range   Lactic Acid, Venous 1.5 0.5 - 1.9 mmol/L    Comment: Performed at [facility], [address] [place], [place] [address]  Protime-INR     Status: Abnormal   Collection Time: 03/20/22  3:23 PM  Result Value Ref Range   Prothrombin Time 19.7 (H) 11.4 - 15.2 seconds   INR 1.7 (H) 0.8 - 1.2    Comment: (NOTE) INR goal varies based  on device and disease states. Performed at [facility], [address] [place], [place] [address]   Sample to Blood Bank     Status: None   Collection Time: 03/20/22  3:23 PM  Result Value Ref Range   Blood Bank Specimen SAMPLE AVAILABLE FOR TESTING    Sample Expiration      03/21/2022,2359 Performed at [facility], [address] [place], [place] [address]   CK     Status: None   Collection Time: 03/20/22  3:23 PM  Result Value Ref Range   Total CK 149 38 - 234 U/L    Comment: Performed at [facility], [address], [place], [place] [address]  Ammonia     Status: None   Collection Time: 03/20/22  3:23 PM  Result Value Ref Range   Ammonia 12 9 - 35 umol/L    Comment: Performed at [facility], [address] [place], [place] [address]  Acetaminophen level     Status: Abnormal   Collection Time: 03/20/22  3:34 PM  Result Value Ref Range   Acetaminophen (Tylenol), Serum <10 (L) 10 - 30 ug/mL    Comment: (NOTE) Therapeutic concentrations vary significantly. A range of 10-30 ug/mL  may be an effective concentration for many patients. However, some  are best treated at concentrations outside of this range. Acetaminophen concentrations >150 ug/mL at 4 hours after ingestion  and >50 ug/mL at 12 hours after ingestion are often associated with  toxic reactions.  Performed at [facility], [address] [place], [place]  [address]   Salicylate level     Status: Abnormal   Collection Time: 03/20/22  3:34 PM  Result Value Ref Range   Salicylate Lvl <7.0 (L) 7.0 - 30.0 mg/dL    Comment: Performed at [facility], [address] [place], [place] [address]  Ethanol     Status: None   Collection Time: 03/20/22  3:34 PM  Result Value Ref Range   Alcohol, Ethyl (B) <10 <10 mg/dL    Comment: (NOTE) Lowest detectable limit for serum alcohol is 10 mg/dL.  For medical purposes only. Performed at [facility], [address], [place], [place] [address]   Blood gas, venous     Status: Abnormal   Collection Time: 03/20/22  4:08 PM  Result Value Ref Range   pH, Ven 7.36 7.25 - 7.43   pCO2, Ven 72 (HH) 44 - 60 mmHg    Comment: CRITICAL RESULT CALLED TO, READ BACK BY AND VERIFIED WITH:  B. BANDS @ 1638 BY STEPHTR 03/20/22    pO2, Ven <31 (LL) 32 - 45 mmHg    Comment: CRITICAL RESULT CALLED TO, READ BACK BY AND VERIFIED WITH:  B. BANDS @ 1638 BY STEPHTR 03/20/22    Bicarbonate 40.7 (H) 20.0 - 28.0 mmol/L   Acid-Base Excess 12.0 (H) 0.0 - 2.0 mmol/L   O2 Saturation 14.3 %   Patient temperature 37.1    Collection site LEFT ANTECUBITAL    Drawn by [phone]     Comment: Performed at [facility], [address] [place], [place] [address]  Lipase, blood     Status: None   Collection Time: 03/20/22  5:47 PM  Result Value Ref Range   Lipase 43 11 - 51 U/L    Comment: Performed at [facility], [address], [place], [place] [address]  Basic metabolic panel     Status: Abnormal   Collection Time: 03/20/22  5:47 PM  Result Value Ref Range  Sodium 141 135 - 145 mmol/L   Potassium 4.0 3.5 - 5.1 mmol/L   Chloride 101 98 - 111 mmol/L   CO2 34 (H) 22 - 32 mmol/L   Glucose, Bld 116 (H) 70 - 99 mg/dL    Comment: Glucose reference range applies only to samples taken after fasting for at least 8 hours.   BUN 27 (H) 8 - 23 mg/dL   Creatinine, Ser 0.98 (H) 0.44 - 1.00 mg/dL   Calcium 8.4 (L) 8.9 - 10.3 mg/dL   GFR, Estimated  56 (L) >60 mL/min    Comment: (NOTE) Calculated using the CKD-EPI Creatinine Equation (2021)    Anion gap 6 5 - 15    Comment: Performed at [facility], [address] [place], [place] [address]  HIV Antibody (routine testing w rflx)     Status: None   Collection Time: 03/20/22  5:47 PM  Result Value Ref Range   HIV Screen 4th Generation wRfx Non Reactive Non Reactive    Comment: Performed at [facility], [address], [place], [place] [address]  Troponin I (High Sensitivity)     Status: Abnormal   Collection Time: 03/20/22  5:47 PM  Result Value Ref Range   Troponin I (High Sensitivity) 240 (HH) <18 ng/L    Comment: CRITICAL RESULT CALLED TO, READ BACK BY AND VERIFIED WITH: NICHOLS,K AT 2111 ON 03/20/2022 BY MOSLEY,J (NOTE) Elevated high sensitivity troponin I (hsTnI) values and significant  changes across serial measurements may suggest ACS but many other  chronic and acute conditions are known to elevate hsTnI results.  Refer to the Links section for chest pain algorithms and additional  guidance. Performed at [facility], [address], [place], [place] [address]   Rapid urine drug screen (hospital performed)     Status: Abnormal   Collection Time: 03/20/22  7:22 PM  Result Value Ref Range   Opiates NONE DETECTED NONE DETECTED   Cocaine NONE DETECTED NONE DETECTED   Benzodiazepines POSITIVE (A) NONE DETECTED   Amphetamines NONE DETECTED NONE DETECTED   Tetrahydrocannabinol NONE DETECTED NONE DETECTED   Barbiturates NONE DETECTED NONE DETECTED    Comment: (NOTE) DRUG SCREEN FOR MEDICAL PURPOSES ONLY.  IF CONFIRMATION IS NEEDED FOR ANY PURPOSE, NOTIFY LAB WITHIN 5 DAYS.  LOWEST DETECTABLE LIMITS FOR URINE DRUG SCREEN Drug Class                     Cutoff (ng/mL) Amphetamine and metabolites    1000 Barbiturate and metabolites    200 Benzodiazepine                 200 Tricyclics and metabolites     300 Opiates and metabolites        300 Cocaine and  metabolites        300 THC                            50 Performed at [facility], [address], [place], [place] [address]   Troponin I (High Sensitivity)     Status: Abnormal   Collection Time: 03/20/22 10:26 PM  Result Value Ref Range   Troponin I (High Sensitivity) 208 (HH) <18 ng/L    Comment: CRITICAL RESULT CALLED TO, READ BACK BY AND VERIFIED WITH: NICKOLS,K @ 2315 ON 03/20/22 BY JUW Performed at [facility], [address] [place], [place] [address]   Blood gas, arterial  Status: Abnormal   Collection Time: 03/21/22 12:52 AM  Result Value Ref Range   FIO2 40.0 %   pH, Arterial 7.41 7.35 - 7.45   pCO2 arterial 57 (H) 32 - 48 mmHg   pO2, Arterial 74 (L) 83 - 108 mmHg   Bicarbonate 36.1 (H) 20.0 - 28.0 mmol/L   Acid-Base Excess 9.4 (H) 0.0 - 2.0 mmol/L   O2 Saturation 95.2 %   Patient temperature 37.0    Collection site LEFT BRACHIAL    Drawn by [id number]    Allens test (pass/fail) PASS PASS    Comment: Performed at [facility], [address], [place], [place] [address]  Comprehensive metabolic panel     Status: Abnormal   Collection Time: 03/21/22  4:46 AM  Result Value Ref Range   Sodium 138 135 - 145 mmol/L   Potassium 3.6 3.5 - 5.1 mmol/L   Chloride 99 98 - 111 mmol/L   CO2 33 (H) 22 - 32 mmol/L   Glucose, Bld 97 70 - 99 mg/dL    Comment: Glucose reference range applies only to samples taken after fasting for at least 8 hours.   BUN 22 8 - 23 mg/dL   Creatinine, Ser 8.29 0.44 - 1.00 mg/dL   Calcium 8.4 (L) 8.9 - 10.3 mg/dL   Total Protein 5.7 (L) 6.5 - 8.1 g/dL   Albumin 3.1 (L) 3.5 - 5.0 g/dL   AST 562 (H) 15 - 41 U/L   ALT 197 (H) 0 - 44 U/L   Alkaline Phosphatase 83 38 - 126 U/L   Total Bilirubin 1.7 (H) 0.3 - 1.2 mg/dL   GFR, Estimated >13 >08 mL/min    Comment: (NOTE) Calculated using the CKD-EPI Creatinine Equation (2021)    Anion gap 6 5 - 15    Comment: Performed at [facility], [address] [place], [place] [address]  Magnesium     Status:  None   Collection Time: 03/21/22  4:46 AM  Result Value Ref Range   Magnesium 1.9 1.7 - 2.4 mg/dL    Comment: Performed at [facility], [address], [place], [place] [address]  CBC with Differential/Platelet     Status: Abnormal   Collection Time: 03/21/22  4:46 AM  Result Value Ref Range   WBC 8.5 4.0 - 10.5 K/uL   RBC 3.60 (L) 3.87 - 5.11 MIL/uL   Hemoglobin 12.5 12.0 - 15.0 g/dL   HCT 52.8 41.3 - 24.4 %   MCV 107.5 (H) 80.0 - 100.0 fL   MCH 34.7 (H) 26.0 - 34.0 pg   MCHC 32.3 30.0 - 36.0 g/dL   RDW 01.0 27.2 - 53.6 %   Platelets 106 (L) 150 - 400 K/uL    Comment: Immature Platelet Fraction may be clinically indicated, consider ordering this additional test [id number]    nRBC 0.0 0.0 - 0.2 %   Neutrophils Relative % 78 %   Neutro Abs 6.7 1.7 - 7.7 K/uL   Lymphocytes Relative 13 %   Lymphs Abs 1.1 0.7 - 4.0 K/uL   Monocytes Relative 8 %   Monocytes Absolute 0.7 0.1 - 1.0 K/uL   Eosinophils Relative 0 %   Eosinophils Absolute 0.0 0.0 - 0.5 K/uL   Basophils Relative 0 %   Basophils Absolute 0.0 0.0 - 0.1 K/uL   Immature Granulocytes 1 %   Abs Immature Granulocytes 0.05 0.00 - 0.07 K/uL    Comment: Performed at [facility], [address], [place], [place] [address]  Troponin I (High Sensitivity)     Status: Abnormal   Collection Time: 03/21/22  4:46 AM  Result Value Ref Range   Troponin I (High Sensitivity) 263 (HH) <18 ng/L    Comment: CRITICAL RESULT CALLED TO, READ BACK BY AND VERIFIED WITH: NICHOLS,K AT 6:30AM ON 03/21/22 BY FESTERMAN,C (NOTE) Elevated high sensitivity troponin I (hsTnI) values and significant  changes across serial measurements may suggest ACS but many other  chronic and acute conditions are known to elevate hsTnI results.  Refer to the Links section for chest pain algorithms and additional  guidance. Performed at [facility], [address] [place], [place] [address]   TSH     Status: None   Collection Time: 03/21/22  4:47 AM  Result Value  Ref Range   TSH 2.546 0.350 - 4.500 uIU/mL    Comment: Performed by a 3rd Generation assay with a functional sensitivity of <=0.01 uIU/mL. Performed at [facility], [address] [place], [place] [address]     DG CHEST PORT 1 VIEW  Result Date: 03/22/2022 CLINICAL DATA:  Multiple rib fractures EXAM: PORTABLE CHEST 1 VIEW COMPARISON:  Portable exam 0813 hours compared to 03/20/2022 FINDINGS: Upper normal heart size. Mediastinal contours and pulmonary vascularity normal. Atherosclerotic calcification aorta. Emphysematous changes with BILATERAL lower lobe atelectasis and small RIGHT pleural effusion. No definite infiltrate or pneumothorax. Diffuse osseous demineralization. Known LEFT rib fractures by recent CT are inadequately demonstrated radiographically. IMPRESSION: COPD changes with bibasilar atelectasis and small RIGHT pleural effusion. Aortic Atherosclerosis (ICD10-I70.0) and Emphysema (ICD10-J43.9). Electronically Signed   By: Ulyses Southward M.D.   On: 03/22/2022 08:42   ECHOCARDIOGRAM COMPLETE  Result Date: 03/21/2022    ECHOCARDIOGRAM REPORT   Patient Name:   TIAWANA FORGY Date of Exam: 03/21/2022 Medical Rec #:  [id number]      Height:       60.0 in Accession #:    [id number]     Weight:       115.0 lb Date of Birth:  06-May-1956      BSA:          1.475 m Patient Age:    65 years       BP:           140/80 mmHg Patient Gender: F              HR:           96 bpm. [facility] Procedure: 2D Echo, Cardiac Doppler and Color Doppler Indications:    R06.02 SOB. Acute respiratory distress.  History:        Patient has no prior history of Echocardiogram examinations.                 Signs/Symptoms:Shortness of Breath and Dyspnea; Risk                 Factors:Current Smoker. Elevated troponin.  Sonographer:    Sheralyn Boatman RDCS Referring Phys: [id number] ASIA B ZIERLE-GHOSH  Sonographer Comments: Technically difficult study due to poor echo windows, suboptimal parasternal window, suboptimal apical  window and suboptimal subcostal window. Patient moving constantly during exam. Unable to obtain on- axis images. Patient could not move due to broken leg. Attempted to move from right decubitus position to sight left decubitus. Patient moaning during test. IMPRESSIONS  1. Limited windows and study. Left ventricular ejection fraction, by estimation, is 60 to 65%. The left ventricle has normal function. The left ventricle has no regional  wall motion abnormalities. Left ventricular diastolic parameters are indeterminate.  2. Right ventricular systolic function is normal. The right ventricular size is not well visualized. Tricuspid regurgitation signal is inadequate for assessing PA pressure.  3. Moderate pleural effusion.  4. Mild mitral valve regurgitation.  5. The aortic valve was not well visualized. Aortic valve regurgitation is not visualized.  6. The inferior vena cava is dilated in size with <50% respiratory variability, suggesting right atrial pressure of 15 mmHg. Comparison(s): No prior Echocardiogram. Conclusion(s)/Recommendation(s): Normal biventricular function without evidence of hemodynamically significant valvular heart disease. FINDINGS  Left Ventricle: Limited windows and study. Left ventricular ejection fraction, by estimation, is 60 to 65%. The left ventricle has normal function. The left ventricle has no regional wall motion abnormalities. The left ventricular internal cavity size was normal in size. There is no left ventricular hypertrophy. Left ventricular diastolic parameters are indeterminate. Right Ventricle: The right ventricular size is not well visualized. Right ventricular systolic function is normal. Tricuspid regurgitation signal is inadequate for assessing PA pressure. Left Atrium: Left atrial size was normal in size. Right Atrium: Right atrial size was normal in size. Pericardium: There is no evidence of pericardial effusion. Mitral Valve: Mild mitral valve regurgitation. Tricuspid  Valve: Tricuspid valve regurgitation is not demonstrated. Aortic Valve: The aortic valve was not well visualized. Aortic valve regurgitation is not visualized. Pulmonic Valve: The pulmonic valve was grossly normal. Pulmonic valve regurgitation is not visualized. Aorta: The aortic root and ascending aorta are structurally normal, with no evidence of dilitation. Venous: The inferior vena cava is dilated in size with less than 50% respiratory variability, suggesting right atrial pressure of 15 mmHg. IAS/Shunts: The interatrial septum was not well visualized. Additional Comments: There is a moderate pleural effusion.  LEFT VENTRICLE PLAX 2D LVIDd:         3.40 cm     Diastology LVIDs:         2.20 cm     LV e' medial:    5.03 cm/s LV PW:         0.90 cm     LV E/e' medial:  17.6 LV IVS:        0.90 cm     LV e' lateral:   4.46 cm/s LVOT diam:     1.80 cm     LV E/e' lateral: 19.9 LV SV:         28 LV SV Index:   19 LVOT Area:     2.54 cm  LV Volumes (MOD) LV vol d, MOD A4C: 34.0 ml LV vol s, MOD A4C: 11.5 ml LV SV MOD A4C:     34.0 ml RIGHT VENTRICLE            IVC RV S prime:     7.14 cm/s  IVC diam: 2.60 cm LEFT ATRIUM           Index        RIGHT ATRIUM           Index LA diam:      2.90 cm 1.97 cm/m   RA Area:     10.10 cm LA Vol (A4C): 19.6 ml 13.28 ml/m  RA Volume:   19.50 ml  13.22 ml/m  AORTIC VALVE             PULMONIC VALVE LVOT Vmax:   61.70 cm/s  PR End Diast Vel: 1.77 msec LVOT Vmean:  40.400 cm/s LVOT VTI:    0.111 m  AORTA Ao  Root diam: 2.60 cm Ao Asc diam:  2.90 cm MITRAL VALVE MV Area (PHT): 5.02 cm    SHUNTS MV Decel Time: 151 msec    Systemic VTI:  0.11 m MV E velocity: 88.60 cm/s  Systemic Diam: 1.80 cm MV A velocity: 60.90 cm/s MV E/A ratio:  1.45 PhotographerMary Branch Electronically signed by Carolan ClinesMary Branch Signature Date/Time: 03/21/2022/12:19:34 PM    Final    US Abdomen Limited RUQ (LIVER/GB)  Result Date: 03/21/2022 CLINICAL DATA:  Liver failure EXAM: ULTRASOUND ABDOMEN LIMITED RIGHT UPPER QUADRANT  COMPARISON:  CT abdomen pelvis 03/20/2022 FINDINGS: Gallbladder: Gallstones: None Sludge: None Gallbladder Wall: Within normal limits Pericholecystic fluid: None Sonographic Murphy's Sign: Negative per technologist Common bile duct: Diameter: 3 mm Liver: Parenchymal echogenicity: Within normal limits Contours: Normal Lesions: None Portal vein: Patent.  Hepatopetal flow Other: Trace perihepatic ascites. Minimal right pleural effusion partially visualized. IMPRESSION: 1. No significant sonographic abnormality of the gallbladder or liver. 2. Trace perihepatic ascites and minimal right pleural effusion. Electronically Signed   By: Acquanetta BellingFarhaan  Mir M.D.   On: 03/21/2022 09:19   DG Tibia/Fibula Right  Result Date: 03/20/2022 CLINICAL DATA:  Known tibial and fibular fractures following reduction, initial encounter EXAM: RIGHT TIBIA AND FIBULA - 2 VIEW COMPARISON:  Film from earlier in the same day. FINDINGS: Casting material is now noted in place. Previously seen tibial fracture has been reduced somewhat. Proximal fibular fracture is again noted and stable. No new focal abnormality is noted. IMPRESSION: Slight reduction of tibial fracture site. Casting material is noted. Electronically Signed   By: Alcide CleverMark  Lukens M.D.   On: 03/20/2022 21:40   CT CHEST ABDOMEN PELVIS W CONTRAST  Result Date: 03/20/2022 CLINICAL DATA:  Fall, found on floor EXAM: CT CHEST, ABDOMEN, AND PELVIS WITH CONTRAST TECHNIQUE: Multidetector CT imaging of the chest, abdomen and pelvis was performed following the standard protocol during bolus administration of intravenous contrast. RADIATION DOSE REDUCTION: This exam was performed according to the departmental dose-optimization program which includes automated exposure control, adjustment of the mA and/or kV according to patient size and/or use of iterative reconstruction technique. CONTRAST:  100mL OMNIPAQUE IOHEXOL 300 MG/ML  SOLN COMPARISON:  CT abdomen pelvis, 03/18/2010 FINDINGS: CT CHEST  FINDINGS Cardiovascular: Aortic atherosclerosis. Normal heart size. No pericardial effusion. Mediastinum/Nodes: No enlarged mediastinal, hilar, or axillary lymph nodes. Thyroid gland, trachea, and esophagus demonstrate no significant findings. Lungs/Pleura: Severe emphysema. Mild, diffuse bilateral bronchial wall thickening. Interlobular septal thickening. Small bilateral pleural effusions and associated atelectasis or consolidation. Musculoskeletal: No chest wall abnormality. Mildly displaced, acute fractures of the lateral and posterior left seventh through twelfth ribs. CT ABDOMEN PELVIS FINDINGS Hepatobiliary: No solid liver abnormality is seen. No gallstones, gallbladder wall thickening, or biliary dilatation. Pancreas: Unremarkable. No pancreatic ductal dilatation or surrounding inflammatory changes. Spleen: Normal in size without significant abnormality. Adrenals/Urinary Tract: Adrenal glands are unremarkable. 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 The left kidney is normal, without renal calculi, solid lesion, or hydronephrosis. Bladder is unremarkable. Stomach/Bowel: Stomach is within normal limits. Appendix appears normal. No evidence of bowel wall thickening, distention, or inflammatory changes. Vascular/Lymphatic: Aortic atherosclerosis. No enlarged abdominal or pelvic lymph nodes. Reproductive: No mass or other abnormality. Other: No abdominal wall hernia or abnormality. Small volume perihepatic ascites. Musculoskeletal: No acute osseous findings. IMPRESSION: 1. Mildly displaced, acute fractures of the lateral and posterior left seventh through twelfth ribs. 2. Small bilateral pleural effusions and associated atelectasis or consolidation. No associated pneumothorax. 3. Diffuse bilateral bronchial wall  thickening and interlobular septal thickening, most consistent with pulmonary edema. 4. Severe emphysema. 5. Small volume simple fluid attenuation perihepatic ascites. No direct CT  evidence of abdominal or pelvic organ injury. 6. Solitary left kidney. Aortic Atherosclerosis (ICD10-I70.0) and Emphysema (ICD10-J43.9). Electronically Signed   By: Jearld Lesch M.D.   On: 03/20/2022 17:32   CT HEAD WO CONTRAST  Result Date: 03/20/2022 CLINICAL DATA:  Head trauma, moderate-severe; Polytrauma, blunt. Fall EXAM: CT HEAD WITHOUT CONTRAST CT CERVICAL SPINE WITHOUT CONTRAST TECHNIQUE: Multidetector CT imaging of the head and cervical spine was performed following the standard protocol without intravenous contrast. Multiplanar CT image reconstructions of the cervical spine were also generated. RADIATION DOSE REDUCTION: This exam was performed according to the departmental dose-optimization program which includes automated exposure control, adjustment of the mA and/or kV according to patient size and/or use of iterative reconstruction technique. COMPARISON:  None Available. FINDINGS: CT HEAD FINDINGS Brain: No evidence of large-territorial acute infarction. No parenchymal hemorrhage. No mass lesion. No extra-axial collection. No mass effect or midline shift. No hydrocephalus. Basilar cisterns are patent. Vascular: No hyperdense vessel. Atherosclerotic calcifications are present within the cavernous internal carotid arteries. Skull: No acute fracture or focal lesion. Sinuses/Orbits: Paranasal sinuses and mastoid air cells are clear. Bilateral lens replacement. Otherwise the orbits are unremarkable. Other: None. CT CERVICAL SPINE FINDINGS Alignment: Normal. Skull base and vertebrae: No acute fracture. No aggressive appearing focal osseous lesion or focal pathologic process. Soft tissues and spinal canal: No prevertebral fluid or swelling. No visible canal hematoma. Upper chest: Emphysematous changes. Interlobular septal wall thickening. Other: None. IMPRESSION: 1. No acute intracranial abnormality. 2. No acute displaced fracture or traumatic listhesis of the cervical spine. 3.  Emphysema (ICD10-J43.9).  4. Possible pulmonary edema. Please see separately dictated CT chest 03/20/2022. Electronically Signed   By: Tish Frederickson M.D.   On: 03/20/2022 17:14   CT CERVICAL SPINE WO CONTRAST  Result Date: 03/20/2022 CLINICAL DATA:  Head trauma, moderate-severe; Polytrauma, blunt. Fall EXAM: CT HEAD WITHOUT CONTRAST CT CERVICAL SPINE WITHOUT CONTRAST TECHNIQUE: Multidetector CT imaging of the head and cervical spine was performed following the standard protocol without intravenous contrast. Multiplanar CT image reconstructions of the cervical spine were also generated. RADIATION DOSE REDUCTION: This exam was performed according to the departmental dose-optimization program which includes automated exposure control, adjustment of the mA and/or kV according to patient size and/or use of iterative reconstruction technique. COMPARISON:  None Available. FINDINGS: CT HEAD FINDINGS Brain: No evidence of large-territorial acute infarction. No parenchymal hemorrhage. No mass lesion. No extra-axial collection. No mass effect or midline shift. No hydrocephalus. Basilar cisterns are patent. Vascular: No hyperdense vessel. Atherosclerotic calcifications are present within the cavernous internal carotid arteries. Skull: No acute fracture or focal lesion. Sinuses/Orbits: Paranasal sinuses and mastoid air cells are clear. Bilateral lens replacement. Otherwise the orbits are unremarkable. Other: None. CT CERVICAL SPINE FINDINGS Alignment: Normal. Skull base and vertebrae: No acute fracture. No aggressive appearing focal osseous lesion or focal pathologic process. Soft tissues and spinal canal: No prevertebral fluid or swelling. No visible canal hematoma. Upper chest: Emphysematous changes. Interlobular septal wall thickening. Other: None. IMPRESSION: 1. No acute intracranial abnormality. 2. No acute displaced fracture or traumatic listhesis of the cervical spine. 3.  Emphysema (ICD10-J43.9). 4. Possible pulmonary edema. Please see  separately dictated CT chest 03/20/2022. Electronically Signed   By: Tish Frederickson M.D.   On: 03/20/2022 17:14   DG Pelvis Portable  Result Date: 03/20/2022 CLINICAL DATA:  Trauma EXAM: PORTABLE  PELVIS 1-2 VIEWS COMPARISON:  March 09, 2010 FINDINGS: Evaluation is limited by technique. No pelvic diastasis. Degenerative changes of bilateral hips. Limited assessment of the sacrum secondary to overlapping bowel contents. No definitive acute displaced fracture is visualized. IMPRESSION: Limited evaluation due to technique. No definitive acute displaced fracture is visualized. If persistent clinical concern, recommend dedicated cross-sectional imaging or additional radiographic views. Electronically Signed   By: Meda Klinefelter M.D.   On: 03/20/2022 16:02   DG Chest Port 1 View  Result Date: 03/20/2022 CLINICAL DATA:  Trauma EXAM: PORTABLE CHEST 1 VIEW COMPARISON:  March 18, 2010 FINDINGS: The cardiomediastinal silhouette is enlarged in contour, increased since 2011.Atherosclerotic calcifications. No pleural effusion. No pneumothorax. Diffuse coarse interstitial opacities. Questionable nodular opacity at the RIGHT apex. There are several age indeterminate LEFT-sided rib fractures of the approximate fifth and sixth ribs. IMPRESSION: 1. Age indeterminate LEFT-sided rib fractures. Recommend correlation with point tenderness. No pneumothorax is identified. 2. Questionable RIGHT apical nodular opacity versus summation artifact. Consider PA and lateral chest radiograph versus dedicated CT scan for improved evaluation. 3. Increased cardiomegaly in comparison to prior from 2011. 4. Diffuse coarse reticulation may reflect a degree of underlying interstitial lung disease or pulmonary emphysema. Electronically Signed   By: Meda Klinefelter M.D.   On: 03/20/2022 16:01   DG Tibia/Fibula Right Port  Result Date: 03/20/2022 CLINICAL DATA:  Blunt Trauma EXAM: PORTABLE RIGHT TIBIA AND FIBULA - 2 VIEW COMPARISON:   None Available. FINDINGS: Osteopenia. There is an oblique fracture of the distal tibial shaft with minimal lateral displacement of the distal fragment. Nondisplaced component extends inferiorly. There is a minimally displaced fracture of the fibular head. No unexpected radiopaque foreign body. Soft tissue edema. IMPRESSION: Minimally displaced oblique fracture of the distal tibial shaft and a minimally displaced fracture of the fibular head. Electronically Signed   By: Meda Klinefelter M.D.   On: 03/20/2022 15:57    Review of Systems  HENT:  Negative for ear discharge, ear pain, hearing loss and tinnitus.   Eyes:  Negative for photophobia and pain.  Respiratory:  Negative for cough and shortness of breath.   Cardiovascular:  Negative for chest pain.  Gastrointestinal:  Negative for abdominal pain, nausea and vomiting.  Genitourinary:  Negative for dysuria, flank pain, frequency and urgency.  Musculoskeletal:  Positive for arthralgias (Right lower leg). Negative for back pain, myalgias and neck pain.  Neurological:  Negative for dizziness and headaches.  Hematological:  Does not bruise/bleed easily.  Psychiatric/Behavioral:  The patient is not nervous/anxious.    Blood pressure (!) 109/56, pulse 95, temperature 97.9 F (36.6 C), temperature source Oral, resp. rate 13, weight 38.7 kg, SpO2 100 %. Physical Exam Constitutional:      General: She is not in acute distress.    Appearance: She is well-developed. She is not diaphoretic.  HENT:     Head: Normocephalic and atraumatic.  Eyes:     General: No scleral icterus.       Right eye: No discharge.        Left eye: No discharge.     Conjunctiva/sclera: Conjunctivae normal.  Cardiovascular:     Rate and Rhythm: Normal rate and regular rhythm.  Pulmonary:     Effort: Pulmonary effort is normal. No respiratory distress.  Musculoskeletal:     Cervical back: Normal range of motion.     Comments: RLE No traumatic wounds, ecchymosis, or  rash  Short leg splint in place  No knee effusion  Knee stable  to varus/ valgus and anterior/posterior stress  Sens DPN, SPN, TN intact  Motor EHL 5/5  Toes perfused, No significant edema  Skin:    General: Skin is warm and dry.  Neurological:     Mental Status: She is alert.  Psychiatric:        Mood and Affect: Mood normal.        Behavior: Behavior normal.     Assessment/Plan: Right tibia fx -- Will tentatively plan IMN tomorrow with Dr. Jena Gauss based on medical suitability. Please keep NPO after MN. Multiple medical problems including opiate abuse, anxiety, arthritis, and heavy smoking -- per primary service    Freeman Caldron, PA-C Orthopedic Surgery [phone] 03/22/2022, 10:02 AM

## 2022-03-22 NOTE — TOC CAGE-AID Note (Signed)
Transition of Care [facility]) - CAGE-AID Screening   Patient Details  Name: Valerie Bradley MRN: [id number] Date of Birth: [birth year]  Transition of Care [facility]) CM/SW Contact:    Coralee Pesa, [place] Phone Number: 03/22/2022, 10:59 AM   Clinical Narrative: CSW met with pt at bedside and completed CAGE- AID assessment. Pt states she does not do drugs or alcohol.   CAGE-AID Screening:    Have You Ever Felt You Ought to Cut Down on Your Drinking or Drug Use?: No Have People Annoyed You By Critizing Your Drinking Or Drug Use?: No Have You Felt Bad Or Guilty About Your Drinking Or Drug Use?: No Have You Ever Had a Drink or Used Drugs First Thing In The Morning to Steady Your Nerves or to Get Rid of a Hangover?: No CAGE-AID Score: 0  Substance Abuse Education Offered: No

## 2022-03-22 NOTE — TOC Initial Note (Signed)
Transition of Care [facility]) - Initial/Assessment Note    Patient Details  Name: Valerie Bradley MRN: [id number] Date of Birth: 02-07-1956  Transition of Care [facility]) CM/SW Contact:    Valerie Bradley, [place] Phone Number: 03/22/2022, 11:01 AM  Clinical Narrative:                 CSW met with pt at bedside to complete assessment. Pt states she lives alone with her dog "Flossie", but her family is near by to check on her. Her son, Valerie Bradley, and her son in law assist as needed. Pt states she has no financial concerns and feels safe returning home. CSW and pt discussed her upcoming surgery. CSW advised that MD consulted TOC as she may need SNF when she is ready to leave the hospital. Pt states she was at the [facility] before, but she did not like it. She had a fractured rib, and they were not careful about it. CSW explained that referrals can be sent out so she has additional options for rehab. Pt is on the fence about SNF. CSW reminded pt it is just short term, with the plan of getting her home. CSW advised that if PT and MD recommend SNF, it is because they are worried about her going home alone, and want to make sure she is safe and doesn't fall again. Pt stated she would think about SNF if it was recommended. TOC will continue to follow for DC planning.   Expected Discharge Plan: Skilled Nursing Facility Barriers to Discharge: Continued Medical Work up, Ship broker, SNF Pending bed offer   Patient Goals and CMS Choice Patient states their goals for this hospitalization and ongoing recovery are:: Pt would like to be able to return home with her dog. CMS Medicare.gov Compare Post Acute Care list provided to:: Patient Choice offered to / list presented to : Patient  Expected Discharge Plan and Services Expected Discharge Plan: [place] Choice: [place] arrangements for the past 2 months: Single Family Home                                       Prior Living Arrangements/Services Living arrangements for the past 2 months: Single Family Home Lives with:: Self Patient language and need for interpreter reviewed:: Yes Do you feel safe going back to the place where you live?: Yes      Need for Family Participation in Patient Care: Yes (Comment) Care giver support system in place?: Yes (comment)   Criminal Activity/Legal Involvement Pertinent to Current Situation/Hospitalization: No - Comment as needed  Activities of Daily Living      Permission Sought/Granted Permission sought to share information with : Family Supports Permission granted to share information with : No              Emotional Assessment Appearance:: Appears stated age Attitude/Demeanor/Rapport: Engaged Affect (typically observed): Pleasant Orientation: : Oriented to Self, Oriented to Place, Oriented to  Time, Oriented to Situation Alcohol / Substance Use: Not Applicable Psych Involvement: No (comment)  Admission diagnosis:  Transaminitis [R74.01] Encephalopathy [G93.40] Other ascites [R18.8] Acute respiratory failure with hypoxia (HCC) [J96.01] Closed fracture of right tibia and fibula, initial encounter [S82.201A, S82.401A] Acute respiratory failure with hypoxia and hypercapnia (HCC) [J96.01, J96.02] Closed fracture of multiple ribs of left side, initial encounter [S22.42XA] Patient Active Problem List  Diagnosis Date Noted   Acute respiratory failure with hypoxia ([place]) 91/36/8599   Acute metabolic encephalopathy 23/41/4436   Traumatic closed displaced fracture of rib on left side 03/20/2022   Closed tibia fracture 03/20/2022   Fibula fracture 03/20/2022   Anxiety 03/20/2022   Fall at home, initial encounter 03/20/2022   Tobacco use disorder 03/20/2022   Elevated troponin 03/20/2022   PCP:  Pcp, No Pharmacy:   [facility] [place] [address] Phone:  [phone] Fax: [phone]     Social Determinants of Health (SDOH) Interventions    Readmission Risk Interventions     No data to display

## 2022-03-22 NOTE — Progress Notes (Signed)
PT Cancellation Note  Patient Details Name: AALIVIA MCGRAW MRN: [id number] DOB: 1956-01-02   Cancelled Treatment:    Reason Eval/Treat Not Completed: Patient not medically ready Awaiting ortho recommendations, including WB status due to right tib/fib fx prior to PT evaluation. Will follow.   Marguarite Arbour A Hermina Barnard 03/22/2022, 7:25 AM Marisa Severin, PT, DPT Acute Rehabilitation Services Secure chat preferred Office [phone]

## 2022-03-23 ENCOUNTER — Encounter (HOSPITAL_COMMUNITY)
Admission: EM | Disposition: A | Discharge status: Skilled Nursing Facility | Payer: Self-pay | Source: Home / Self Care | Attending: Internal Medicine

## 2022-03-23 ENCOUNTER — Inpatient Hospital Stay (HOSPITAL_COMMUNITY): Payer: Medicare Other

## 2022-03-23 ENCOUNTER — Encounter (HOSPITAL_COMMUNITY): Payer: Self-pay | Admitting: Family Medicine

## 2022-03-23 ENCOUNTER — Inpatient Hospital Stay (HOSPITAL_COMMUNITY): Payer: Medicare Other | Admitting: Anesthesiology

## 2022-03-23 ENCOUNTER — Other Ambulatory Visit: Payer: Self-pay

## 2022-03-23 DIAGNOSIS — J9601 Acute respiratory failure with hypoxia: Secondary | ICD-10-CM | POA: Diagnosis not present

## 2022-03-23 DIAGNOSIS — S82201A Unspecified fracture of shaft of right tibia, initial encounter for closed fracture: Secondary | ICD-10-CM

## 2022-03-23 HISTORY — PX: TIBIA IM NAIL INSERTION: SHX2516

## 2022-03-23 LAB — HEPATITIS PANEL, ACUTE
HCV Ab: NONREACTIVE
Hep A IgM: NONREACTIVE
Hep B C IgM: NONREACTIVE
Hepatitis B Surface Ag: NONREACTIVE

## 2022-03-23 LAB — COMPREHENSIVE METABOLIC PANEL
ALT: 157 U/L — ABNORMAL HIGH (ref 0–44)
AST: 103 U/L — ABNORMAL HIGH (ref 15–41)
Albumin: 2.9 g/dL — ABNORMAL LOW (ref 3.5–5.0)
Alkaline Phosphatase: 68 U/L (ref 38–126)
Anion gap: 12 (ref 5–15)
BUN: 16 mg/dL (ref 8–23)
CO2: 33 mmol/L — ABNORMAL HIGH (ref 22–32)
Calcium: 8.4 mg/dL — ABNORMAL LOW (ref 8.9–10.3)
Chloride: 93 mmol/L — ABNORMAL LOW (ref 98–111)
Creatinine, Ser: 0.96 mg/dL (ref 0.44–1.00)
GFR, Estimated: >60 mL/min (ref >60)
Glucose, Bld: 87 mg/dL (ref 70–99)
Potassium: 3.1 mmol/L — ABNORMAL LOW (ref 3.5–5.1)
Sodium: 138 mmol/L (ref 135–145)
Total Bilirubin: 1.8 mg/dL — ABNORMAL HIGH (ref 0.3–1.2)
Total Protein: 5.2 g/dL — ABNORMAL LOW (ref 6.5–8.1)

## 2022-03-23 LAB — FOLATE: Folate: 6.4 ng/mL (ref >5.9)

## 2022-03-23 LAB — CK: Total CK: 41 U/L (ref 38–234)

## 2022-03-23 LAB — SURGICAL PCR SCREEN
MRSA, PCR: NEGATIVE
Staphylococcus aureus: POSITIVE — AB

## 2022-03-23 LAB — MAGNESIUM: Magnesium: 1.7 mg/dL (ref 1.7–2.4)

## 2022-03-23 LAB — TROPONIN I (HIGH SENSITIVITY): Troponin I (High Sensitivity): 74 ng/L — ABNORMAL HIGH (ref <18)

## 2022-03-23 LAB — VITAMIN B12: Vitamin B-12: 266 pg/mL (ref 180–914)

## 2022-03-23 SURGERY — INSERTION, INTRAMEDULLARY ROD, TIBIA
Anesthesia: General | Site: Leg Lower | Laterality: Right

## 2022-03-23 MED ORDER — HYDROMORPHONE HCL 1 MG/ML IJ SOLN
INTRAMUSCULAR | Status: AC
  Filled 2022-03-23: qty 1

## 2022-03-23 MED ORDER — ENSURE ENLIVE PO LIQD
237.0000 mL | Freq: Two times a day (BID) | ORAL | Status: DC
  Administered 2022-03-24 – 2022-03-29 (×7): 237 mL via ORAL
  Filled 2022-03-23 (×4): qty 237

## 2022-03-23 MED ORDER — LACTATED RINGERS IV SOLN: INTRAVENOUS | Status: DC

## 2022-03-23 MED ORDER — BUPRENORPHINE HCL-NALOXONE HCL 8-2 MG SL SUBL: 1.0000 | SUBLINGUAL_TABLET | Freq: Every day | SUBLINGUAL | Status: DC

## 2022-03-23 MED ORDER — ADULT MULTIVITAMIN W/MINERALS CH
1.0000 | ORAL_TABLET | Freq: Every day | ORAL | Status: DC
  Administered 2022-03-23 – 2022-03-30 (×8): 1 via ORAL
  Filled 2022-03-23 (×8): qty 1

## 2022-03-23 MED ORDER — METOCLOPRAMIDE HCL 5 MG/ML IJ SOLN: 5.0000 mg | Freq: Three times a day (TID) | INTRAMUSCULAR | Status: DC | PRN

## 2022-03-23 MED ORDER — MOMETASONE FURO-FORMOTEROL FUM 200-5 MCG/ACT IN AERO
2.0000 | INHALATION_SPRAY | Freq: Two times a day (BID) | RESPIRATORY_TRACT | Status: DC
  Administered 2022-03-23 – 2022-03-30 (×14): 2 via RESPIRATORY_TRACT
  Filled 2022-03-23: qty 8.8

## 2022-03-23 MED ORDER — CEFAZOLIN SODIUM-DEXTROSE 2-4 GM/100ML-% IV SOLN: 2.0000 g | INTRAVENOUS | Status: DC

## 2022-03-23 MED ORDER — CHLORHEXIDINE GLUCONATE 0.12 % MT SOLN
OROMUCOSAL | Status: AC
  Administered 2022-03-23: 15 mL via OROMUCOSAL
  Filled 2022-03-23: qty 15

## 2022-03-23 MED ORDER — POLYETHYLENE GLYCOL 3350 17 G PO PACK: 17.0000 g | PACK | Freq: Every day | ORAL | Status: DC | PRN

## 2022-03-23 MED ORDER — LIDOCAINE 2% (20 MG/ML) 5 ML SYRINGE
INTRAMUSCULAR | Status: AC
  Filled 2022-03-23: qty 5

## 2022-03-23 MED ORDER — DOCUSATE SODIUM 100 MG PO CAPS
100.0000 mg | ORAL_CAPSULE | Freq: Two times a day (BID) | ORAL | Status: DC
  Administered 2022-03-23 – 2022-03-30 (×13): 100 mg via ORAL
  Filled 2022-03-23 (×14): qty 1

## 2022-03-23 MED ORDER — CEFAZOLIN SODIUM-DEXTROSE 2-4 GM/100ML-% IV SOLN
2.0000 g | Freq: Three times a day (TID) | INTRAVENOUS | Status: AC
  Administered 2022-03-23 – 2022-03-24 (×3): 2 g via INTRAVENOUS
  Filled 2022-03-23 (×5): qty 100

## 2022-03-23 MED ORDER — ROCURONIUM BROMIDE 10 MG/ML (PF) SYRINGE
PREFILLED_SYRINGE | INTRAVENOUS | Status: AC
  Filled 2022-03-23: qty 10

## 2022-03-23 MED ORDER — MIDAZOLAM HCL 2 MG/2ML IJ SOLN
INTRAMUSCULAR | Status: AC
  Filled 2022-03-23: qty 2

## 2022-03-23 MED ORDER — FENTANYL CITRATE (PF) 100 MCG/2ML IJ SOLN
INTRAMUSCULAR | Status: AC
  Filled 2022-03-23: qty 2

## 2022-03-23 MED ORDER — DEXAMETHASONE SODIUM PHOSPHATE 10 MG/ML IJ SOLN
INTRAMUSCULAR | Status: DC | PRN
  Administered 2022-03-23: 10 mg via INTRAVENOUS

## 2022-03-23 MED ORDER — VANCOMYCIN HCL 1000 MG IV SOLR
INTRAVENOUS | Status: DC | PRN
  Administered 2022-03-23: 1000 mg

## 2022-03-23 MED ORDER — FENTANYL CITRATE (PF) 100 MCG/2ML IJ SOLN
25.0000 ug | INTRAMUSCULAR | Status: DC | PRN
  Administered 2022-03-23 (×3): 50 ug via INTRAVENOUS

## 2022-03-23 MED ORDER — CHLORHEXIDINE GLUCONATE CLOTH 2 % EX PADS
6.0000 | MEDICATED_PAD | Freq: Every day | CUTANEOUS | Status: AC
  Administered 2022-03-23 – 2022-03-24 (×2): 6 via TOPICAL

## 2022-03-23 MED ORDER — METHOCARBAMOL 1000 MG/10ML IJ SOLN: 500.0000 mg | Freq: Four times a day (QID) | INTRAVENOUS | Status: DC | PRN

## 2022-03-23 MED ORDER — ROCURONIUM BROMIDE 10 MG/ML (PF) SYRINGE
PREFILLED_SYRINGE | INTRAVENOUS | Status: DC | PRN
  Administered 2022-03-23: 60 mg via INTRAVENOUS

## 2022-03-23 MED ORDER — ORAL CARE MOUTH RINSE: 15.0000 mL | Freq: Once | OROMUCOSAL | Status: AC

## 2022-03-23 MED ORDER — MUPIROCIN 2 % EX OINT
1.0000 | TOPICAL_OINTMENT | Freq: Two times a day (BID) | CUTANEOUS | Status: AC
  Administered 2022-03-23 – 2022-03-27 (×10): 1 via NASAL
  Filled 2022-03-23 (×2): qty 22

## 2022-03-23 MED ORDER — ENOXAPARIN SODIUM 40 MG/0.4ML IJ SOSY
40.0000 mg | PREFILLED_SYRINGE | INTRAMUSCULAR | Status: DC
  Administered 2022-03-24 – 2022-03-30 (×7): 40 mg via SUBCUTANEOUS
  Filled 2022-03-23 (×7): qty 0.4

## 2022-03-23 MED ORDER — PROPOFOL 10 MG/ML IV BOLUS
INTRAVENOUS | Status: AC
  Filled 2022-03-23: qty 20

## 2022-03-23 MED ORDER — ONDANSETRON HCL 4 MG/2ML IJ SOLN
INTRAMUSCULAR | Status: DC | PRN
  Administered 2022-03-23: 4 mg via INTRAVENOUS

## 2022-03-23 MED ORDER — LIDOCAINE 2% (20 MG/ML) 5 ML SYRINGE
INTRAMUSCULAR | Status: DC | PRN
  Administered 2022-03-23: 60 mg via INTRAVENOUS

## 2022-03-23 MED ORDER — UMECLIDINIUM BROMIDE 62.5 MCG/ACT IN AEPB
1.0000 | INHALATION_SPRAY | Freq: Every day | RESPIRATORY_TRACT | Status: DC
  Administered 2022-03-24 – 2022-03-30 (×7): 1 via RESPIRATORY_TRACT
  Filled 2022-03-23 (×2): qty 7

## 2022-03-23 MED ORDER — DEXAMETHASONE SODIUM PHOSPHATE 10 MG/ML IJ SOLN
INTRAMUSCULAR | Status: AC
  Filled 2022-03-23: qty 1

## 2022-03-23 MED ORDER — PHENYLEPHRINE 80 MCG/ML (10ML) SYRINGE FOR IV PUSH (FOR BLOOD PRESSURE SUPPORT)
PREFILLED_SYRINGE | INTRAVENOUS | Status: DC | PRN
  Administered 2022-03-23: 160 ug via INTRAVENOUS
  Administered 2022-03-23: 80 ug via INTRAVENOUS

## 2022-03-23 MED ORDER — CEFAZOLIN SODIUM-DEXTROSE 2-4 GM/100ML-% IV SOLN
INTRAVENOUS | Status: AC
  Filled 2022-03-23: qty 100

## 2022-03-23 MED ORDER — MIDAZOLAM HCL 5 MG/5ML IJ SOLN
INTRAMUSCULAR | Status: DC | PRN
  Administered 2022-03-23: 1 mg via INTRAVENOUS

## 2022-03-23 MED ORDER — METOCLOPRAMIDE HCL 5 MG PO TABS: 5.0000 mg | ORAL_TABLET | Freq: Three times a day (TID) | ORAL | Status: DC | PRN

## 2022-03-23 MED ORDER — DIAZEPAM 2 MG PO TABS: 2.0000 mg | ORAL_TABLET | Freq: Three times a day (TID) | ORAL | Status: DC | PRN

## 2022-03-23 MED ORDER — HYDROMORPHONE HCL 1 MG/ML IJ SOLN
0.2500 mg | INTRAMUSCULAR | Status: DC | PRN
  Administered 2022-03-23 (×4): 0.5 mg via INTRAVENOUS

## 2022-03-23 MED ORDER — PROPOFOL 10 MG/ML IV BOLUS
INTRAVENOUS | Status: DC | PRN
  Administered 2022-03-23: 150 mg via INTRAVENOUS

## 2022-03-23 MED ORDER — FENTANYL CITRATE (PF) 250 MCG/5ML IJ SOLN
INTRAMUSCULAR | Status: AC
  Filled 2022-03-23: qty 5

## 2022-03-23 MED ORDER — CHLORHEXIDINE GLUCONATE 0.12 % MT SOLN: 15.0000 mL | Freq: Once | OROMUCOSAL | Status: AC

## 2022-03-23 MED ORDER — FENTANYL CITRATE (PF) 250 MCG/5ML IJ SOLN
INTRAMUSCULAR | Status: DC | PRN
  Administered 2022-03-23: 100 ug via INTRAVENOUS
  Administered 2022-03-23 (×2): 50 ug via INTRAVENOUS

## 2022-03-23 MED ORDER — ONDANSETRON HCL 4 MG/2ML IJ SOLN: 4.0000 mg | Freq: Once | INTRAMUSCULAR | Status: DC | PRN

## 2022-03-23 MED ORDER — ONDANSETRON HCL 4 MG/2ML IJ SOLN
INTRAMUSCULAR | Status: AC
  Filled 2022-03-23: qty 2

## 2022-03-23 MED ORDER — VANCOMYCIN HCL 1000 MG IV SOLR
INTRAVENOUS | Status: AC
  Filled 2022-03-23: qty 20

## 2022-03-23 MED ORDER — PHENYLEPHRINE 80 MCG/ML (10ML) SYRINGE FOR IV PUSH (FOR BLOOD PRESSURE SUPPORT)
PREFILLED_SYRINGE | INTRAVENOUS | Status: AC
  Filled 2022-03-23: qty 10

## 2022-03-23 MED ORDER — METHOCARBAMOL 500 MG PO TABS
500.0000 mg | ORAL_TABLET | Freq: Four times a day (QID) | ORAL | Status: DC | PRN
  Administered 2022-03-24 – 2022-03-30 (×9): 500 mg via ORAL
  Filled 2022-03-23 (×9): qty 1

## 2022-03-23 MED ORDER — BUPRENORPHINE HCL-NALOXONE HCL 8-2 MG SL SUBL
1.0000 | SUBLINGUAL_TABLET | Freq: Every day | SUBLINGUAL | Status: DC
  Administered 2022-03-24 – 2022-03-30 (×7): 1 via SUBLINGUAL
  Filled 2022-03-23 (×8): qty 1

## 2022-03-23 MED ORDER — POVIDONE-IODINE 10 % EX SWAB: 2.0000 | Freq: Once | CUTANEOUS | Status: DC

## 2022-03-23 MED ORDER — 0.9 % SODIUM CHLORIDE (POUR BTL) OPTIME
TOPICAL | Status: DC | PRN
  Administered 2022-03-23: 1000 mL

## 2022-03-23 MED ORDER — MORPHINE SULFATE (PF) 2 MG/ML IV SOLN: 2.0000 mg | INTRAVENOUS | Status: DC | PRN

## 2022-03-23 MED ORDER — SODIUM CHLORIDE 0.9 % IV SOLN: INTRAVENOUS | Status: DC

## 2022-03-23 MED ORDER — LACTATED RINGERS IV SOLN
INTRAVENOUS | Status: DC | PRN
  Administered 2022-03-23: 1000 mL via INTRAVENOUS

## 2022-03-23 MED ORDER — CHLORHEXIDINE GLUCONATE 4 % EX LIQD: 60.0000 mL | Freq: Once | CUTANEOUS | Status: DC

## 2022-03-23 SURGICAL SUPPLY — 63 items
APL PRP STRL LF DISP 70% ISPRP (MISCELLANEOUS) ×1
BAG COUNTER SPONGE SURGICOUNT (BAG) ×2 IMPLANT
BAG SPNG CNTER NS LX DISP (BAG) ×1
BIT DRILL CALIBRATED 4.2 (BIT) IMPLANT
BIT DRILL SHORT 4.2 (BIT) IMPLANT
BLADE SURG 10 STRL SS (BLADE) ×4 IMPLANT
BNDG CMPR STD VLCR NS LF 5.8X4 (GAUZE/BANDAGES/DRESSINGS) ×1
BNDG COHESIVE 4X5 TAN STRL (GAUZE/BANDAGES/DRESSINGS) ×2 IMPLANT
BNDG ELASTIC 4X5.8 VLCR NS LF (GAUZE/BANDAGES/DRESSINGS) IMPLANT
BNDG ELASTIC 4X5.8 VLCR STR LF (GAUZE/BANDAGES/DRESSINGS) ×2 IMPLANT
BNDG ELASTIC 6X5.8 VLCR STR LF (GAUZE/BANDAGES/DRESSINGS) ×2 IMPLANT
BNDG GAUZE DERMACEA FLUFF 4 (GAUZE/BANDAGES/DRESSINGS) ×2 IMPLANT
BNDG GZE DERMACEA 4 6PLY (GAUZE/BANDAGES/DRESSINGS)
BRUSH SCRUB EZ PLAIN DRY (MISCELLANEOUS) ×4 IMPLANT
CHLORAPREP W/TINT 26 (MISCELLANEOUS) ×2 IMPLANT
COVER SURGICAL LIGHT HANDLE (MISCELLANEOUS) ×4 IMPLANT
DRAPE C-ARM 42X72 X-RAY (DRAPES) ×2 IMPLANT
DRAPE C-ARMOR (DRAPES) ×2 IMPLANT
DRAPE HALF SHEET 40X57 (DRAPES) ×4 IMPLANT
DRAPE IMP U-DRAPE 54X76 (DRAPES) ×4 IMPLANT
DRAPE INCISE IOBAN 66X45 STRL (DRAPES) IMPLANT
DRAPE ORTHO SPLIT 77X108 STRL (DRAPES) ×2
DRAPE SURG ORHT 6 SPLT 77X108 (DRAPES) ×4 IMPLANT
DRAPE U-SHAPE 47X51 STRL (DRAPES) ×2 IMPLANT
DRESSING MEPILEX FLEX 4X4 (GAUZE/BANDAGES/DRESSINGS) IMPLANT
DRILL BIT CALIBRATED 4.2 (BIT) ×1
DRILL BIT SHORT 4.2 (BIT) ×2
DRSG ADAPTIC 3X8 NADH LF (GAUZE/BANDAGES/DRESSINGS) ×2 IMPLANT
DRSG MEPILEX FLEX 4X4 (GAUZE/BANDAGES/DRESSINGS) ×2
DRSG MEPILEX POST OP 4X8 (GAUZE/BANDAGES/DRESSINGS) IMPLANT
DRSG MEPITEL 4X7.2 (GAUZE/BANDAGES/DRESSINGS) IMPLANT
ELECT REM PT RETURN 9FT ADLT (ELECTROSURGICAL) ×1
ELECTRODE REM PT RTRN 9FT ADLT (ELECTROSURGICAL) ×2 IMPLANT
GAUZE SPONGE 4X4 12PLY STRL (GAUZE/BANDAGES/DRESSINGS) ×2 IMPLANT
GLOVE BIO SURGEON STRL SZ 6.5 (GLOVE) ×6 IMPLANT
GLOVE BIO SURGEON STRL SZ7.5 (GLOVE) ×8 IMPLANT
GLOVE BIOGEL PI IND STRL 6.5 (GLOVE) ×2 IMPLANT
GLOVE BIOGEL PI IND STRL 7.5 (GLOVE) ×2 IMPLANT
GOWN STRL REUS W/ TWL LRG LVL3 (GOWN DISPOSABLE) ×4 IMPLANT
GOWN STRL REUS W/TWL LRG LVL3 (GOWN DISPOSABLE) ×2
GUIDEWIRE 3.2X400 (WIRE) IMPLANT
KIT BASIN OR (CUSTOM PROCEDURE TRAY) ×2 IMPLANT
KIT TURNOVER KIT B (KITS) ×2 IMPLANT
NAIL TIB STRL 10X315 (Nail) IMPLANT
PACK TOTAL JOINT (CUSTOM PROCEDURE TRAY) ×2 IMPLANT
PAD ARMBOARD 7.5X6 YLW CONV (MISCELLANEOUS) ×4 IMPLANT
REAMER ROD DEEP FLUTE 2.5X950 (INSTRUMENTS) IMPLANT
SCREW LOCK 28X5XLOPRFL (Screw) IMPLANT
SCREW LOCK IM 58X5XLOPRFL NS (Screw) IMPLANT
SCREW LOCK IM NAIL 5X30 (Screw) IMPLANT
SCREW LOCK IM NAIL 5X58 (Screw) ×1 IMPLANT
SCREW LOCK LP 5X28 (Screw) ×1 IMPLANT
SCREW LOCK LP 5X38 LT (Screw) IMPLANT
STAPLER VISISTAT 35W (STAPLE) ×2 IMPLANT
SUT MNCRL AB 3-0 PS2 18 (SUTURE) ×2 IMPLANT
SUT MNCRL AB 3-0 PS2 27 (SUTURE) IMPLANT
SUT VIC AB 0 CT1 27 (SUTURE) ×1
SUT VIC AB 0 CT1 27XBRD ANBCTR (SUTURE) IMPLANT
SUT VIC AB 2-0 CT1 27 (SUTURE) ×2
SUT VIC AB 2-0 CT1 TAPERPNT 27 (SUTURE) IMPLANT
TOWEL GREEN STERILE (TOWEL DISPOSABLE) ×4 IMPLANT
TOWEL GREEN STERILE FF (TOWEL DISPOSABLE) ×2 IMPLANT
YANKAUER SUCT BULB TIP NO VENT (SUCTIONS) IMPLANT

## 2022-03-23 NOTE — Anesthesia Postprocedure Evaluation (Signed)
Anesthesia Post Note  Patient: Valerie Bradley  Procedure(s) Performed: INTRAMEDULLARY NAILING TIBIA FRACTURE RIGHT (Right: Leg Lower)     Patient location during evaluation: PACU Anesthesia Type: General Level of consciousness: awake Pain management: pain level not controlled Vital Signs Assessment: post-procedure vital signs reviewed and stable Respiratory status: spontaneous breathing Cardiovascular status: stable Postop Assessment: no apparent nausea or vomiting Anesthetic complications: no   No notable events documented.  Last Vitals:  Vitals:   03/23/22 1846 03/23/22 1900  BP:  (!) 108/52  Pulse:    Resp: 17 20  Temp:    SpO2:      Last Pain:  Vitals:   03/23/22 1800  TempSrc: Oral  PainSc:                  Huston Foley

## 2022-03-23 NOTE — Progress Notes (Signed)
Initial Nutrition Assessment  DOCUMENTATION CODES:   Not applicable  INTERVENTION:  Once diet resumes, recommend: Ensure Enlive po BID, each supplement provides 350 kcal and 20 grams of protein. MVI with minerals daily  NUTRITION DIAGNOSIS:   Increased nutrient needs related to post-op healing as evidenced by estimated needs.  GOAL:   Patient will meet greater than or equal to 90% of their needs  MONITOR:   PO intake, Supplement acceptance, Labs, Weight trends  REASON FOR ASSESSMENT:   Consult Assessment of nutrition requirement/status  ASSESSMENT:   Pt admitted after a ground level fall, multiple rib fractures and tib/fib fracture. PMH significant for narcotic use on suboxone therapy, pulmonary edema, emphysema.  Plans for IMN of R tibia today.   Pt off unit at time of visit for procedure. Unable to obtain nutrition related history at this time.   Meal completions: 10/3: 40% lunch, 45% dinner  Unfortunately there is limited documentation of weight history within the last year to review. Pt's weight in 2017 noted to be 52.2 kg. Current admit weight is 38.6 kg. Uncertain how recent this weight loss has occurred.  Suspect pt has a degree of malnutrition, however unable to confirm at this time given limited nutrition/weight history. Will reassess once able to obtain more detailed information.   Pt wound likely benefit from addition of nutrition supplements to optimize nutritional intake aid in post op healing.  Medications: colace,   Labs: potassium 3.0, Cr 1.04, AST 211, ALT 200, GFR 59  NUTRITION - FOCUSED PHYSICAL EXAM: Pt off unit. Deferred to follow up.   Diet Order:   Diet Order             Diet regular Room service appropriate? Yes; Fluid consistency: Thin  Diet effective now                   EDUCATION NEEDS:   No education needs have been identified at this time  Skin:  Skin Assessment: Skin Integrity Issues: Skin Integrity Issues::  Incisions Incisions: R leg  Last BM:  PTA  Height:   Ht Readings from Last 1 Encounters:  03/23/22 5\' 2"  (1.575 m)    Weight:   Wt Readings from Last 1 Encounters:  03/23/22 38.6 kg    Ideal Body Weight:  45.4 kg  BMI:  Body mass index is 15.55 kg/m.  Estimated Nutritional Needs:   Kcal:  1300-1500  Protein:  65-80g  Fluid:  >/=1.5L  Clayborne Dana, RDN, LDN Clinical Nutrition

## 2022-03-23 NOTE — Progress Notes (Signed)
Lab called, inpatient CBC lab drawn this AM clotted off. Pt credited. Called to notify Dr. Jillyn Hidden, ok to use yesterday's labs prior to surgery and no need to redraw.

## 2022-03-23 NOTE — Interval H&P Note (Signed)
History and Physical Interval Note:  03/23/2022 10:00 AM  Valerie Bradley  has presented today for surgery, with the diagnosis of Tibia Fracture.  The various methods of treatment have been discussed with the patient and family. After consideration of risks, benefits and other options for treatment, the patient has consented to  Procedure(s): INTRAMEDULLARY (IM) NAIL TIBIAL RIGHT (Right) as a surgical intervention.  The patient's history has been reviewed, patient examined, no change in status, stable for surgery.  I have reviewed the patient's chart and labs.  Questions were answered to the patient's satisfaction.     Lennette Bihari P Briele Lagasse

## 2022-03-23 NOTE — Progress Notes (Addendum)
Pt refused BiPAP for the evening. RT will cont to monitor as needed.

## 2022-03-23 NOTE — Anesthesia Preprocedure Evaluation (Addendum)
Anesthesia Evaluation  Patient identified by MRN, date of birth, ID band Patient awake    Reviewed: Allergy & Precautions, NPO status , Patient's Chart, lab work & pertinent test results  Airway Mallampati: I  TM Distance: >3 FB Neck ROM: Full    Dental  (+) Lower Dentures, Upper Dentures, Dental Advisory Given   Pulmonary Current Smoker and Patient abstained from smoking.   Pulmonary exam normal        Cardiovascular negative cardio ROS Normal cardiovascular exam     Neuro/Psych  PSYCHIATRIC DISORDERS Anxiety     negative neurological ROS     GI/Hepatic negative GI ROS,,,  Endo/Other  negative endocrine ROS    Renal/GU negative Renal ROS  negative genitourinary   Musculoskeletal   Abdominal  (+) + scaphoid   Peds  Hematology negative hematology ROS (+)   Anesthesia Other Findings   Reproductive/Obstetrics                             Anesthesia Physical Anesthesia Plan  ASA: 2  Anesthesia Plan: General   Post-op Pain Management:    Induction: Intravenous  PONV Risk Score and Plan: 2 and Ondansetron and Treatment may vary due to age or medical condition  Airway Management Planned: Oral ETT  Additional Equipment: None  Intra-op Plan:   Post-operative Plan: Extubation in OR  Informed Consent: I have reviewed the patients History and Physical, chart, labs and discussed the procedure including the risks, benefits and alternatives for the proposed anesthesia with the patient or authorized representative who has indicated his/her understanding and acceptance.     Dental advisory given  Plan Discussed with: CRNA  Anesthesia Plan Comments:         Anesthesia Quick Evaluation

## 2022-03-23 NOTE — Transfer of Care (Signed)
Immediate Anesthesia Transfer of Care Note  Patient: Valerie Bradley  Procedure(s) Performed: INTRAMEDULLARY NAILING TIBIA FRACTURE RIGHT (Right: Leg Lower)  Patient Location: PACU  Anesthesia Type:General  Level of Consciousness: awake, oriented, and patient cooperative  Airway & Oxygen Therapy: Patient Spontanous Breathing and Patient connected to nasal cannula oxygen  Post-op Assessment: Report given to RN and Post -op Vital signs reviewed and stable  Post vital signs: Reviewed  Last Vitals:  Vitals Value Taken Time  BP 131/72 03/23/22 1237  Temp    Pulse 74 03/23/22 1240  Resp 13 03/23/22 1240  SpO2 99 % 03/23/22 1240  Vitals shown include unvalidated device data.  Last Pain:  Vitals:   03/23/22 0957  TempSrc: Oral  PainSc:       Patients Stated Pain Goal: 5 (59/16/38 4665)  Complications: No notable events documented.

## 2022-03-23 NOTE — Care Management Important Message (Signed)
Important Message  Patient Details  Name: Valerie Bradley MRN: [id number] Date of Birth: 1956/03/15   Medicare Important Message Given:  Yes     Shelda Altes 03/23/2022, 10:11 AM

## 2022-03-23 NOTE — Anesthesia Postprocedure Evaluation (Signed)
Anesthesia Post Note  Patient: Valerie Bradley  Procedure(s) Performed: INTRAMEDULLARY NAILING TIBIA FRACTURE RIGHT (Right: Leg Lower)     Patient location during evaluation: PACU Anesthesia Type: General Level of consciousness: awake Pain management: pain level not controlled Vital Signs Assessment: post-procedure vital signs reviewed and stable Respiratory status: spontaneous breathing Cardiovascular status: stable Postop Assessment: no apparent nausea or vomiting Anesthetic complications: no   No notable events documented.  Last Vitals:  Vitals:   03/23/22 1846 03/23/22 1900  BP:  (!) 108/52  Pulse:    Resp: 17 20  Temp:    SpO2:      Last Pain:  Vitals:   03/23/22 1800  TempSrc: Oral  PainSc:                  John F Teancum Brule Jr     

## 2022-03-23 NOTE — Op Note (Signed)
Orthopaedic Surgery Operative Note (CSN: [id number] ) Date of Surgery: 03/23/2022  Admit Date: 03/20/2022   Diagnoses: Pre-Op Diagnoses: Right tibia shaft fracture  Post-Op Diagnosis: Same  Procedures: CPT 27559-Intramedullary nailing of right tibia fracture  Surgeons : Primary: Roby Lofts, MD  Assistant: Ulyses Southward, PA-C  Location: OR 3   Anesthesia:General  Antibiotics: Ancef 2g preop with 1 gm vancomycin powder placed topically   Tourniquet time:None    Estimated Blood Loss:50 mL  Complications:None   Specimens:None   Implants: Implant Name Type Inv. Item Serial No. Manufacturer Lot No. LRB No. Used Action  NAIL TIB STRL 10X315 - [id number] Nail NAIL TIB STRL 10X315  [employer] [id number] Right 1 Implanted  SCREW LOCK IM NAIL 5X30 - [id number] Screw SCREW LOCK IM NAIL 5X30  [employer]  Right 1 Implanted  SCREW LOCK LP 5X38 LT - [id number] Screw SCREW LOCK LP 5X38 LT  [employer]  Right 1 Implanted  SCREW LOCK IM NAIL 5X58 - [id number] Screw SCREW LOCK IM NAIL 5X58  [employer]  Right 1 Implanted  SCREW LOCK LP 5X28 - [id number] Screw SCREW LOCK LP 5X28  [employer]  Right 1 Implanted     Indications for Surgery: 66 year old female who sustained a ground-level fall and a right tibial shaft fracture.  Due to the unstable nature of this injury I recommend proceeding with intramedullary nailing of the right tibia.  Risks and benefits were discussed with the patient.  This included but not limited to bleeding, infection, malunion, nonunion, hardware failure, hardware irritation, nerve or blood vessel injury, DVT, even the possible anesthetic complications.  She agreed to proceed with surgery and consent was obtained.  Operative Findings: Intramedullary nailing of right tibial shaft fracture using [employer] TNA 10 x 15 mm nail  Procedure: The patient was identified in the preoperative holding area. Consent was confirmed with the  patient and their family and all questions were answered. The operative extremity was marked after confirmation with the patient. she was then brought back to the operating room by our anesthesia colleagues.  He was placed under general anesthetic.  She was transferred over to a radiolucent flat top table.  The right lower extremity was then prepped and draped in usual sterile fashion.  A timeout was performed to verify the patient, the procedure, and the extremity.  Preoperative antibiotics were dosed.  Fluoroscopic imaging was obtained showing the unstable nature of her injury.  A standard lateral parapatellar incision was made and carried down through skin and subcutaneous tissue.  I incised through the retinaculum to mobilize the patella medially.  I then directed a threaded guidewire at the appropriate starting point and advanced into the proximal metaphysis.  I used an awl to enter the medullary canal.  I then passed a ball-tipped guidewire down the center canal and seated it into the distal metaphysis.  I then measured the length of the nail and chose to use a 315 mm nail.  I then sequentially reamed from 8 mm to 11 mm to obtain decent chatter.  I decided to place a 10 mm nail.  The 10x39mm was placed.  Make sure that was positioned appropriately the fracture is well aligned.  I then used perfect circle technique to place 2 medial to lateral distal interlocking screws.  I then used the targeting arm to place 2 proximal interlocking screws.  The targeting arm was removed.  Final fluoroscopic imaging was obtained.  The incisions were copiously irrigated.  A gram of  vancomycin powder was placed into the incision.  Layered closure of 0 Vicryl, 2-0 Vicryl and 3-0 Monocryl with Steri-Strips were used to close the skin.  Sterile dressing was applied.  The patient was then taken to the PACU in stable condition.  Post Op Plan/Instructions: Patient will be weightbearing as tolerated to the right lower  extremity.  She will receive postoperative Ancef.  She will be placed on Lovenox for DVT prophylaxis while inpatient and discharged on aspirin 325 mg daily.  Patient will mobilize with physical and Occupational Therapy.  I was present and performed the entire surgery.  Patrecia Pace, PA-C did assist me throughout the case. An assistant was necessary given the difficulty in approach, maintenance of reduction and ability to instrument the fracture.   Katha Hamming, MD [facility]

## 2022-03-23 NOTE — Hospital Course (Addendum)
66 year old female with a history of anxiety on Valium, was brought to the hospital after a fall.  She was noted to be lethargic when she was found on the floor.  Work-up in the emergency room showed that she did have several rib fractures in the left chest, also noted to have a right tibia/fibula fracture.  She was noted to be hypoxic and hypercapnic and was initially placed on BiPAP.  Further imaging did indicate some volume overload.  EDP discussed case with trauma service as well as orthopedics at [facility].  Patient was transferred to [facility] service recommended no significant change in the therapy from their point of view. Orthopedic service to pay patient for intramedullary nailing of the right tibia fracture on 10/4. Postoperatively remaining stable.  Currently awaiting transfer to SNF.  Medically stable.

## 2022-03-23 NOTE — Progress Notes (Signed)
Progress Note Patient: Valerie Bradley:[id number] DOB: Oct 25, 1955 DOA: 03/20/2022  DOS: the patient was seen and examined on 03/23/2022  Brief hospital course: 66 year old female with a history of anxiety on Valium, was brought to the hospital after a fall.  She was noted to be lethargic when she was found on the floor.  Work-up in the emergency room showed that she did have several rib fractures in the left chest, also noted to have a right tibia/fibula fracture.  She was noted to be hypoxic and hypercapnic and was initially placed on BiPAP.  Further imaging did indicate some volume overload.  EDP discussed case with trauma service as well as orthopedics at [facility].  It was recommended patient be admitted to [facility] where these services can consult on her.  Assessment and Plan: Acute respiratory failure with hypoxia and hypercapnia/undiagnosed underlying COPD with atelectasis: -Possibly related to hypoventilation secondary to medications On admission, she was hypoxic and hypercapnic.  She was placed on BiPAP.  Currently she is on 4 to 5 L oxygen.  Lungs clear to auscultation with some diminished breath sounds at the bases.  She has been smoking 1-1/2 pack/day for several years.  Likely has COPD which is not diagnosed yet.  We will continue to wean to room air.   Concern for acute pulmonary edema: Ruled out. here is no evidence of pulmonary edema, chest x-ray negative for that, no BNP was obtained.  She does not have crackles.  Pulmonary edema ruled out.  Echo is also not showing any diastolic or systolic congestive heart failure.  She received [facility] at [facility].   Closed right tibia and fibula fracture -ED discussed with orthopedics, Dr. Renaye Rakers Underwent intramedullary implant. Monitor.  Left-sided rib fractures, 7th-12th ribs -Secondary to fall -ED discussed with trauma service, Dr. Sophronia Simas who will evaluate patient. Recommended conservative measures. Monitor.    Elevated troponin: Likely demand ischemia and trauma related.  Echo negative for wall motion abnormality.   AKI: Resolved.   Elevated LFTs: Improving. -Right upper quadrant ultrasound does not show any acute obstructive process Hepatitis panel negative.  Fall at home -Patient was found down -Circumstances of fall are not entirely clear -PT eval   Anxiety Resuming Valium at a lower dose.  Chronic pain. Patient is on Suboxone 3 times daily. Will initiate on a daily regimen which per patient is her daily dose.   Acute metabolic/toxic encephalopathy -Patient was found down and was lethargic -Possibly related to benzodiazepine overuse -She was also noted to have elevated PCO2, but this may be more of a chronic finding -Ammonia level normal -See CT head without acute abnormalities -Overall mental status does appear to be improving, currently he is fully alert and oriented.   Underweight. Failure to thrive in adult. Body mass index is 15.55 kg/m. Nutrition Problem: Increased nutrient needs Etiology: post-op healing Nutrition Interventions: Interventions: Ensure Enlive (each supplement provides 350kcal and 20 grams of protein), MVI     Subjective: No nausea no vomiting.  Continues to report severe pain.  Physical Exam: Vitals:   03/23/22 1800 03/23/22 1846 03/23/22 1900 03/23/22 1949  BP: (!) 102/50  (!) 108/52   Pulse: (!) 17     Resp: (!) 22 17 20    Temp: 98.1 F (36.7 C)     TempSrc: Oral     SpO2:    96%  Weight:      Height:       General: Appear in moderate distress; no visible  Abnormal Neck Mass Or lumps, Conjunctiva normal Cardiovascular: S1 and S2 Present, no Murmur, Respiratory: good respiratory effort, Bilateral Air entry present and CTA, no Crackles, no wheezes Abdomen: Bowel Sound present, Non tender  Extremities: no Pedal edema Neurology: alert and oriented to time, place, and person  Gait not checked due to patient safety concerns   Data  Reviewed: I have Reviewed nursing notes, Vitals, and Lab results since pt's last encounter. Pertinent lab results CBC and BMP I have ordered test including CBC and BMP    Family Communication: No one at bedside  Disposition: Status is: Inpatient Remains inpatient appropriate because: We will need to monitor postop recovery  Author: Berle Mull, MD 03/23/2022 8:48 PM  Please look on [email or web address] to find out who is on call.

## 2022-03-23 NOTE — Progress Notes (Signed)
PT Cancellation Note  Patient Details Name: Valerie Bradley MRN: [id number] DOB: August 31, 1955   Cancelled Treatment:    Reason Eval/Treat Not Completed: Patient at procedure or test/unavailable. Pt off unit for surgery. PT will follow up once surgery is complete and the pt is stable for mobility.   Zenaida Niece 03/23/2022, 10:04 AM

## 2022-03-23 NOTE — Anesthesia Procedure Notes (Signed)
Procedure Name: Intubation Date/Time: 03/23/2022 11:36 AM  Performed by: Jenne Campus, CRNAPre-anesthesia Checklist: Patient identified, Emergency Drugs available, Suction available and Patient being monitored Patient Re-evaluated:Patient Re-evaluated prior to induction Oxygen Delivery Method: Circle System Utilized Preoxygenation: Pre-oxygenation with 100% oxygen Induction Type: IV induction Ventilation: Mask ventilation without difficulty Laryngoscope Size: Miller and 3 Grade View: Grade I Tube type: Oral Tube size: 7.0 mm Number of attempts: 1 Airway Equipment and Method: Stylet and Oral airway Placement Confirmation: ETT inserted through vocal cords under direct vision, positive ETCO2 and breath sounds checked- equal and bilateral Secured at: 22 cm Tube secured with: Tape Dental Injury: Teeth and Oropharynx as per pre-operative assessment

## 2022-03-23 NOTE — Evaluation (Signed)
Physical Therapy Evaluation Patient Details Name: Valerie Bradley MRN: [id number] DOB: 12-03-1955 Today's Date: 03/23/2022  History of Present Illness  66 y.o. female presents to [facility] on 03/20/2022 after a fall, with likely overdose. Pt found to have R distal tibia fx and minimally displaced fibula fx. Chest x-ray with fractures of 5 ribs on left side. Pt underwent IM nailing of RLE on 03/23/2022. PMH includes anxiety, OA, opiate abuse.  Clinical Impression  Pt presents to PT with deficits in functional mobility, gait, balance, strength, power. Pt demonstrates RLE weakness, however she is able to tolerate standing and initiation of gait training during eval. Pt is at a high risk for falls due to impaired power and balance, and she will benefit from aggressive mobilization in an effort to return to independence. PT recommends SNF placement as the pt has limited caregiver support and currently requires assistance to maintain balance when standing or attempting to ambulate.       Recommendations for follow up therapy are one component of a multi-disciplinary discharge planning process, led by the attending physician.  Recommendations may be updated based on patient status, additional functional criteria and insurance authorization.  Follow Up Recommendations Skilled nursing-short term rehab (<3 hours/day) Can patient physically be transported by private vehicle: No    Assistance Recommended at Discharge Intermittent Supervision/Assistance  Patient can return home with the following  A lot of help with walking and/or transfers;A lot of help with bathing/dressing/bathroom;Assistance with cooking/housework;Direct supervision/assist for medications management;Direct supervision/assist for financial management;Assist for transportation;Help with stairs or ramp for entrance    Equipment Recommendations Rolling walker (2 wheels);BSC/3in1  Recommendations for Other Services       Functional Status  Assessment Patient has had a recent decline in their functional status and demonstrates the ability to make significant improvements in function in a reasonable and predictable amount of time.     Precautions / Restrictions Precautions Precautions: Fall Restrictions Weight Bearing Restrictions: Yes RLE Weight Bearing: Weight bearing as tolerated      Mobility  Bed Mobility Overal bed mobility: Needs Assistance Bed Mobility: Supine to Sit, Sit to Supine     Supine to sit: Min assist, HOB elevated Sit to supine: Min guard   General bed mobility comments: use of rails, verbal cues for sequencing    Transfers Overall transfer level: Needs assistance Equipment used: Rolling walker (2 wheels) Transfers: Sit to/from Stand Sit to Stand: Min assist           General transfer comment: verbal cues for hand placement, assistance to power up    Ambulation/Gait Ambulation/Gait assistance: Min assist Gait Distance (Feet): 2 Feet Assistive device: Rolling walker (2 wheels) Gait Pattern/deviations: Shuffle Gait velocity: reduced Gait velocity interpretation: <1.31 ft/sec, indicative of household ambulator   General Gait Details: pt takes multiple shuffling steps to left side toward head of bed, limited clearance of LLE  Stairs            Wheelchair Mobility    Modified Rankin (Stroke Patients Only)       Balance Overall balance assessment: Needs assistance Sitting-balance support: No upper extremity supported, Feet supported Sitting balance-Leahy Scale: Fair     Standing balance support: Bilateral upper extremity supported, Reliant on assistive device for balance Standing balance-Leahy Scale: Poor                               Pertinent Vitals/Pain Pain Assessment Pain Assessment: 0-10  Pain Score: 3  Pain Location: RLE at rest Pain Descriptors / Indicators: Sore Pain Intervention(s): Monitored during session    Home Living Family/patient  expects to be discharged to:: Private residence Living Arrangements: Alone Available Help at Discharge: Family;Available PRN/intermittently Type of Home: House Home Access: Stairs to enter Entrance Stairs-Rails: None Entrance Stairs-Number of Steps: 4   Home Layout: One level Home Equipment: None      Prior Function Prior Level of Function : Independent/Modified Independent;Driving                     Hand Dominance        Extremity/Trunk Assessment   Upper Extremity Assessment Upper Extremity Assessment: Overall WFL for tasks assessed    Lower Extremity Assessment Lower Extremity Assessment: RLE deficits/detail RLE Deficits / Details: 2+/5 ankle PF/DF, 2+/5 knee extension, 3/5 hip flexion/abduction/adduction RLE Sensation: decreased light touch    Cervical / Trunk Assessment Cervical / Trunk Assessment: Kyphotic  Communication   Communication: No difficulties  Cognition Arousal/Alertness: Awake/alert Behavior During Therapy: WFL for tasks assessed/performed Overall Cognitive Status: Impaired/Different from baseline Area of Impairment: Memory                     Memory: Decreased short-term memory                  General Comments General comments (skin integrity, edema, etc.): VSS on 2L [place]    Exercises     Assessment/Plan    PT Assessment Patient needs continued PT services  PT Problem List Decreased strength;Decreased activity tolerance;Decreased balance;Decreased range of motion;Decreased mobility;Decreased cognition;Decreased knowledge of use of DME;Decreased safety awareness;Decreased knowledge of precautions;Pain       PT Treatment Interventions DME instruction;Gait training;Functional mobility training;Stair training;Therapeutic activities;Therapeutic exercise;Balance training;Neuromuscular re-education;Patient/family education    PT Goals (Current goals can be found in the Care Plan section)  Acute Rehab PT Goals Patient  Stated Goal: to return to independent mobility PT Goal Formulation: With patient/family Time For Goal Achievement: 04/06/22 Potential to Achieve Goals: Good    Frequency Min 3X/week     Co-evaluation               AM-PAC PT "6 Clicks" Mobility  Outcome Measure Help needed turning from your back to your side while in a flat bed without using bedrails?: A Little Help needed moving from lying on your back to sitting on the side of a flat bed without using bedrails?: A Little Help needed moving to and from a bed to a chair (including a wheelchair)?: A Lot Help needed standing up from a chair using your arms (e.g., wheelchair or bedside chair)?: A Little Help needed to walk in hospital room?: Total Help needed climbing 3-5 steps with a railing? : Total 6 Click Score: 13    End of Session Equipment Utilized During Treatment: Oxygen Activity Tolerance: Patient tolerated treatment well Patient left: in bed;with call bell/phone within reach;with bed alarm set Nurse Communication: Mobility status PT Visit Diagnosis: Other abnormalities of gait and mobility (R26.89);Pain Pain - Right/Left: Right Pain - part of body: Leg    Time: 1711-1740 PT Time Calculation (min) (ACUTE ONLY): 29 min   Charges:   PT Evaluation $PT Eval Low Complexity: 1 Low          Arlyss Gandy, PT, DPT Acute Rehabilitation Office [phone]   Arlyss Gandy 03/23/2022, 6:00 PM

## 2022-03-24 DIAGNOSIS — J9601 Acute respiratory failure with hypoxia: Secondary | ICD-10-CM | POA: Diagnosis not present

## 2022-03-24 LAB — BASIC METABOLIC PANEL
Anion gap: 9 (ref 5–15)
BUN: 19 mg/dL (ref 8–23)
CO2: 34 mmol/L — ABNORMAL HIGH (ref 22–32)
Calcium: 8.5 mg/dL — ABNORMAL LOW (ref 8.9–10.3)
Chloride: 91 mmol/L — ABNORMAL LOW (ref 98–111)
Creatinine, Ser: 1.3 mg/dL — ABNORMAL HIGH (ref 0.44–1.00)
GFR, Estimated: 45 mL/min — ABNORMAL LOW (ref >60)
Glucose, Bld: 147 mg/dL — ABNORMAL HIGH (ref 70–99)
Potassium: 3.8 mmol/L (ref 3.5–5.1)
Sodium: 134 mmol/L — ABNORMAL LOW (ref 135–145)

## 2022-03-24 LAB — CBC
HCT: 36.7 % (ref 36.0–46.0)
Hemoglobin: 12.5 g/dL (ref 12.0–15.0)
MCH: 34.3 pg — ABNORMAL HIGH (ref 26.0–34.0)
MCHC: 34.1 g/dL (ref 30.0–36.0)
MCV: 100.8 fL — ABNORMAL HIGH (ref 80.0–100.0)
Platelets: 113 10*3/uL — ABNORMAL LOW (ref 150–400)
RBC: 3.64 MIL/uL — ABNORMAL LOW (ref 3.87–5.11)
RDW: 14.4 % (ref 11.5–15.5)
WBC: 7.5 10*3/uL (ref 4.0–10.5)
nRBC: 0 % (ref 0.0–0.2)

## 2022-03-24 LAB — HEPATIC FUNCTION PANEL
ALT: 105 U/L — ABNORMAL HIGH (ref 0–44)
AST: 54 U/L — ABNORMAL HIGH (ref 15–41)
Albumin: 2.8 g/dL — ABNORMAL LOW (ref 3.5–5.0)
Alkaline Phosphatase: 65 U/L (ref 38–126)
Bilirubin, Direct: 0.2 mg/dL (ref 0.0–0.2)
Indirect Bilirubin: 0.7 mg/dL (ref 0.3–0.9)
Total Bilirubin: 0.9 mg/dL (ref 0.3–1.2)
Total Protein: 5.1 g/dL — ABNORMAL LOW (ref 6.5–8.1)

## 2022-03-24 LAB — VITAMIN D 25 HYDROXY (VIT D DEFICIENCY, FRACTURES): Vit D, 25-Hydroxy: 6.49 ng/mL — ABNORMAL LOW (ref 30–100)

## 2022-03-24 MED ORDER — FERROUS GLUCONATE 324 (38 FE) MG PO TABS
324.0000 mg | ORAL_TABLET | Freq: Two times a day (BID) | ORAL | Status: DC
  Administered 2022-03-24 – 2022-03-30 (×12): 324 mg via ORAL
  Filled 2022-03-24 (×14): qty 1

## 2022-03-24 MED ORDER — FE FUMARATE-B12-VIT C-FA-IFC PO CAPS
1.0000 | ORAL_CAPSULE | Freq: Two times a day (BID) | ORAL | Status: DC
  Filled 2022-03-24 (×3): qty 1

## 2022-03-24 MED ORDER — CYANOCOBALAMIN 1000 MCG/ML IJ SOLN
1000.0000 ug | Freq: Once | INTRAMUSCULAR | Status: AC
  Administered 2022-03-24: 1000 ug via SUBCUTANEOUS
  Filled 2022-03-24: qty 1

## 2022-03-24 NOTE — Progress Notes (Signed)
Progress Note Patient: Valerie Bradley OIZ:[id number] DOB: February 02, 1956 DOA: 03/20/2022  DOS: the patient was seen and examined on 03/24/2022  Brief hospital course: 66 year old female with a history of anxiety on Valium, was brought to the hospital after a fall.  She was noted to be lethargic when she was found on the floor.  Work-up in the emergency room showed that she did have several rib fractures in the left chest, also noted to have a right tibia/fibula fracture.  She was noted to be hypoxic and hypercapnic and was initially placed on BiPAP.  Further imaging did indicate some volume overload.  EDP discussed case with trauma service as well as orthopedics at [facility].  It was recommended patient be admitted to [facility] where these services can consult on her.  Assessment and Plan: Acute respiratory failure with hypoxia and hypercapnia/undiagnosed underlying COPD with atelectasis: -Possibly related to hypoventilation secondary to medications On admission, she was hypoxic and hypercapnic.  She was placed on BiPAP.  Currently she is on 4 to 5 L oxygen.  Lungs clear to auscultation with some diminished breath sounds at the bases.  She has been smoking 1-1/2 pack/day for several years.  Likely has COPD which is not diagnosed yet.  Currently on room air.   Concern for acute pulmonary edema: Ruled out. here is no evidence of pulmonary edema, chest x-ray negative for that, no BNP was obtained.  She does not have crackles.  Pulmonary edema ruled out.  Echo is also not showing any diastolic or systolic congestive heart failure.  She received [facility] at [facility].   Closed right tibia and fibula fracture -ED discussed with orthopedics, Dr. Fredonia Highland Underwent intramedullary implant.  PT OT recommends SNF. Monitor.  Left-sided rib fractures, 7th-12th ribs -Secondary to fall -ED discussed with trauma service, Dr. Michaelle Birks who will evaluate patient. Recommended conservative measures. Monitor.    Elevated troponin: Likely demand ischemia and trauma related.  Echo negative for wall motion abnormality.   AKI: Initially resolved.  Now appears to be worsening again.  Monitor off of fluid for now..   Elevated LFTs: Improving. -Right upper quadrant ultrasound does not show any acute obstructive process Hepatitis panel negative.  Fall at home -Patient was found down -Circumstances of fall are not entirely clear -PT eval   Anxiety Resuming Valium at a lower dose.  Chronic pain. Patient is on Suboxone 3 times daily. Will initiate on a daily regimen which per patient is her daily dose.   Acute metabolic/toxic encephalopathy -Patient was found down and was lethargic -Possibly related to benzodiazepine overuse -She was also noted to have elevated PCO2, but this may be more of a chronic finding -Ammonia level normal -See CT head without acute abnormalities -Overall mental status does appear to be improving, currently he is fully alert and oriented. Tolerating reduction of Suboxone.  Underweight. Failure to thrive in adult. Body mass index is 15.55 kg/m. Nutrition Problem: Increased nutrient needs Etiology: post-op healing Nutrition Interventions: Interventions: Ensure Enlive (each supplement provides 350kcal and 20 grams of protein), MVI   Subjective: No acute complaint.  No nausea no vomiting.  Pain 5 out of 10.  Breathing okay.  Off oxygen.  Physical Exam: Vitals:   03/24/22 0927 03/24/22 0928 03/24/22 1314 03/24/22 1324  BP:    (!) 100/54  Pulse:   100 91  Resp:    18  Temp:   98 F (36.7 C) 98.7 F (37.1 C)  TempSrc:  SpO2: 97% 97%  97%  Weight:      Height:       General: Appear in mild distress; no visible Abnormal Neck Mass Or lumps, Conjunctiva normal Cardiovascular: S1 and S2 Present, no Murmur, Respiratory: good respiratory effort, Bilateral Air entry present and CTA, no Crackles, no wheezes Abdomen: Bowel Sound present, Non tender Extremities: no  Pedal edema Neurology: alert and oriented to Self, Place and time.   Data Reviewed: I have Reviewed nursing notes, Vitals, and Lab results since pt's last encounter. Pertinent lab results CBC and BMP I have ordered test including CBC and BMP     Family Communication: Son at bedside  Disposition: Status is: Inpatient Remains inpatient appropriate because: We will need to monitor postop recovery  Author: Berle Mull, MD 03/24/2022 8:21 PM  Please look on [email or web address] to find out who is on call.

## 2022-03-24 NOTE — Progress Notes (Signed)
  RE:  Valerie Bradley       Date of Birth:  06/01/1956     Date:   03/24/22       To Whom It May Concern:  Please be advised that the above-named patient will require a short-term nursing home stay - anticipated 30 days or less for rehabilitation and strengthening.  The plan is for return home.                 MD signature                Date

## 2022-03-24 NOTE — Progress Notes (Signed)
Physical Therapy Treatment Patient Details Name: Valerie Bradley MRN: [id number] DOB: August 29, 1955 Today's Date: 03/24/2022   History of Present Illness 66 y.o. female presents to [facility] hospital on 03/20/2022 after a fall, with likely overdose. Pt found to have R distal tibia fx and minimally displaced fibula fx. Chest x-ray with fractures of 5 ribs on left side. Pt underwent IM nailing of RLE on 03/23/2022. PMH includes anxiety, OA, opiate abuse.    PT Comments    Pt tolerates treatment well, progressing to ambulation for short distances. Pt demonstrates 2 inastances of posterior LOB, reporting the CAM boot feels as if it is tilting her backward. Use of shoe on LLE next session may aide in improving stability. Pt will benefit from aggressive mobilization in an effort to reduce falls risk. PT recommends SNF placement.   Recommendations for follow up therapy are one component of a multi-disciplinary discharge planning process, led by the attending physician.  Recommendations may be updated based on patient status, additional functional criteria and insurance authorization.  Follow Up Recommendations  Skilled nursing-short term rehab (<3 hours/day) Can patient physically be transported by private vehicle: Yes   Assistance Recommended at Discharge Intermittent Supervision/Assistance  Patient can return home with the following A lot of help with walking and/or transfers;A lot of help with bathing/dressing/bathroom;Assistance with cooking/housework;Direct supervision/assist for medications management;Direct supervision/assist for financial management;Assist for transportation;Help with stairs or ramp for entrance   Equipment Recommendations  Rolling walker (2 wheels);BSC/3in1    Recommendations for Other Services       Precautions / Restrictions Precautions Precautions: Fall Required Braces or Orthoses: Other Brace Other Brace: CAM boot RLE Restrictions Weight Bearing Restrictions: Yes RLE Weight  Bearing: Weight bearing as tolerated Other Position/Activity Restrictions: WBAT inCAM     Mobility  Bed Mobility Overal bed mobility: Needs Assistance Bed Mobility: Supine to Sit     Supine to sit: Supervision          Transfers Overall transfer level: Needs assistance Equipment used: Rolling walker (2 wheels) Transfers: Sit to/from Stand Sit to Stand: Min guard, Min assist                Ambulation/Gait Ambulation/Gait assistance: Editor, commissioning (Feet): 12 Feet Assistive device: Rolling walker (2 wheels) Gait Pattern/deviations: Step-to pattern Gait velocity: reduced Gait velocity interpretation: <1.31 ft/sec, indicative of household ambulator   General Gait Details: pt with slowed step-to gait, one posterior LOB. Pt reports it feels as if boot is tilting her backwards. PT suggests bringing shoe for LLE to even LLD   Stairs             Wheelchair Mobility    Modified Rankin (Stroke Patients Only)       Balance Overall balance assessment: Needs assistance Sitting-balance support: No upper extremity supported, Feet supported Sitting balance-Leahy Scale: Good     Standing balance support: Bilateral upper extremity supported, Reliant on assistive device for balance Standing balance-Leahy Scale: Poor                              Cognition Arousal/Alertness: Awake/alert Behavior During Therapy: WFL for tasks assessed/performed Overall Cognitive Status: Within Functional Limits for tasks assessed                                          Exercises  General Comments General comments (skin integrity, edema, etc.): VSS on RA      Pertinent Vitals/Pain Pain Assessment Pain Assessment: Faces Faces Pain Scale: Hurts little more Pain Location: R anterior knee Pain Descriptors / Indicators: Sore Pain Intervention(s): Monitored during session    Home Living                          Prior  Function            PT Goals (current goals can now be found in the care plan section) Acute Rehab PT Goals Patient Stated Goal: to return to independent mobility Progress towards PT goals: Progressing toward goals    Frequency    Min 3X/week      PT Plan Current plan remains appropriate    Co-evaluation              AM-PAC PT "6 Clicks" Mobility   Outcome Measure  Help needed turning from your back to your side while in a flat bed without using bedrails?: A Little Help needed moving from lying on your back to sitting on the side of a flat bed without using bedrails?: A Little Help needed moving to and from a bed to a chair (including a wheelchair)?: A Little Help needed standing up from a chair using your arms (e.g., wheelchair or bedside chair)?: A Little Help needed to walk in hospital room?: Total Help needed climbing 3-5 steps with a railing? : Total 6 Click Score: 14    End of Session   Activity Tolerance: Patient tolerated treatment well Patient left: in chair;with call bell/phone within reach;with chair alarm set Nurse Communication: Mobility status PT Visit Diagnosis: Other abnormalities of gait and mobility (R26.89);Pain Pain - Right/Left: Right Pain - part of body: Leg     Time: 1710-1733 PT Time Calculation (min) (ACUTE ONLY): 23 min  Charges:  $Gait Training: 8-22 mins $Therapeutic Activity: 8-22 mins                     Zenaida Niece, PT, DPT Acute Rehabilitation Office [phone]    Zenaida Niece 03/24/2022, 5:42 PM

## 2022-03-24 NOTE — TOC Progression Note (Addendum)
Transition of Care [facility]) - Progression Note    Patient Details  Name: SHANON SEAWRIGHT MRN: [id number] Date of Birth: [birth year]  Transition of Care [facility]) CM/SW Contact  Joanne Chars, LCSW Phone Number: 03/24/2022, 11:44 AM  Clinical Narrative:   CSW spoke with pt regarding DC recommendation for SNF.  She is agreeable but asked CSW to speak with son Darnelle Maffucci about SNF placement near his home in [place].  CSW spoke with [place] who confirmed he lives in [place], [place].  Discussed potential transportation issues with that distance, he would be willing to transport.  He identified [facility] as a SNF that their family has used before, also a facility on [address].  CSW LM with Donna/admissions at [facility], [phone]. No SNF on [address] per [employer].  1545: TC Donna/[place]: fax: [phone].  Referral faxed.  Expected Discharge Plan: [place] Barriers to Discharge: Continued Medical Work up, Ship broker, SNF Pending bed offer  Expected Discharge Plan and Services Expected Discharge Plan: [place] Choice: [place] arrangements for the past 2 months: Single Family Home                                       Social Determinants of Health (SDOH) Interventions    Readmission Risk Interventions     No data to display

## 2022-03-24 NOTE — Progress Notes (Signed)
Orthopaedic Trauma Progress Note  SUBJECTIVE: Doing fairly well this morning.  Notes pain is well controlled. No chest pain. No SOB. No nausea/vomiting. No other complaints.  Was able to get up and move around with physical therapy yesterday afternoon following surgery.  States this went well.  OBJECTIVE:  Vitals:   03/24/22 0430 03/24/22 0715  BP: 109/66 (!) 104/44  Pulse: 87 84  Resp:  18  Temp: 98 F (36.7 C) 98.6 F (37 C)  SpO2: 93% 99%    General: Sitting up in bed eating breakfast, no acute distress Respiratory: No increased work of breathing.  RLE: Dressing clean, dry, intact.  Tenderness to the tibia as expected.  Tolerates gentle knee range of motion.  Ankle DF/PF intact.  Compartment soft and compressible. + EHL/FHL.  Endorses sensation throughout extremity.  Neurovascularly intact  IMAGING: Stable post op imaging.   LABS:  Results for orders placed or performed during the hospital encounter of 03/20/22 (from the past 24 hour(s))  Comprehensive metabolic panel     Status: Abnormal   Collection Time: 03/23/22  9:42 AM  Result Value Ref Range   Sodium 138 135 - 145 mmol/L   Potassium 3.1 (L) 3.5 - 5.1 mmol/L   Chloride 93 (L) 98 - 111 mmol/L   CO2 33 (H) 22 - 32 mmol/L   Glucose, Bld 87 70 - 99 mg/dL   BUN 16 8 - 23 mg/dL   Creatinine, Ser 0.96 0.44 - 1.00 mg/dL   Calcium 8.4 (L) 8.9 - 10.3 mg/dL   Total Protein 5.2 (L) 6.5 - 8.1 g/dL   Albumin 2.9 (L) 3.5 - 5.0 g/dL   AST 103 (H) 15 - 41 U/L   ALT 157 (H) 0 - 44 U/L   Alkaline Phosphatase 68 38 - 126 U/L   Total Bilirubin 1.8 (H) 0.3 - 1.2 mg/dL   GFR, Estimated >60 >60 mL/min   Anion gap 12 5 - 15  Troponin I (High Sensitivity)     Status: Abnormal   Collection Time: 03/23/22  9:42 AM  Result Value Ref Range   Troponin I (High Sensitivity) 74 (H) <18 ng/L  CK     Status: None   Collection Time: 03/23/22  9:42 AM  Result Value Ref Range   Total CK 41 38 - 234 U/L  Magnesium     Status: None   Collection  Time: 03/23/22  9:42 AM  Result Value Ref Range   Magnesium 1.7 1.7 - 2.4 mg/dL  Vitamin B12     Status: None   Collection Time: 03/23/22  9:42 AM  Result Value Ref Range   Vitamin B-12 266 180 - 914 pg/mL  Folate     Status: None   Collection Time: 03/23/22  9:42 AM  Result Value Ref Range   Folate 6.4 >5.9 ng/mL  Hepatitis panel, acute     Status: None   Collection Time: 03/23/22  9:42 AM  Result Value Ref Range   Hepatitis B Surface Ag NON REACTIVE NON REACTIVE   HCV Ab NON REACTIVE NON REACTIVE   Hep A IgM NON REACTIVE NON REACTIVE   Hep B C IgM NON REACTIVE NON REACTIVE  CBC     Status: Abnormal   Collection Time: 03/24/22  2:57 AM  Result Value Ref Range   WBC 7.5 4.0 - 10.5 K/uL   RBC 3.64 (L) 3.87 - 5.11 MIL/uL   Hemoglobin 12.5 12.0 - 15.0 g/dL   HCT 36.7 36.0 - 46.0 %  MCV 100.8 (H) 80.0 - 100.0 fL   MCH 34.3 (H) 26.0 - 34.0 pg   MCHC 34.1 30.0 - 36.0 g/dL   RDW 14.4 11.5 - 15.5 %   Platelets 113 (L) 150 - 400 K/uL   nRBC 0.0 0.0 - 0.2 %  Basic metabolic panel     Status: Abnormal   Collection Time: 03/24/22  2:57 AM  Result Value Ref Range   Sodium 134 (L) 135 - 145 mmol/L   Potassium 3.8 3.5 - 5.1 mmol/L   Chloride 91 (L) 98 - 111 mmol/L   CO2 34 (H) 22 - 32 mmol/L   Glucose, Bld 147 (H) 70 - 99 mg/dL   BUN 19 8 - 23 mg/dL   Creatinine, Ser 1.30 (H) 0.44 - 1.00 mg/dL   Calcium 8.5 (L) 8.9 - 10.3 mg/dL   GFR, Estimated 45 (L) >60 mL/min   Anion gap 9 5 - 15  Hepatic function panel     Status: Abnormal   Collection Time: 03/24/22  2:57 AM  Result Value Ref Range   Total Protein 5.1 (L) 6.5 - 8.1 g/dL   Albumin 2.8 (L) 3.5 - 5.0 g/dL   AST 54 (H) 15 - 41 U/L   ALT 105 (H) 0 - 44 U/L   Alkaline Phosphatase 65 38 - 126 U/L   Total Bilirubin 0.9 0.3 - 1.2 mg/dL   Bilirubin, Direct 0.2 0.0 - 0.2 mg/dL   Indirect Bilirubin 0.7 0.3 - 0.9 mg/dL    ASSESSMENT: Valerie Bradley is a 66 y.o. female, 1 Day Post-Op s/p INTRAMEDULLARY NAILING TIBIA FRACTURE  RIGHT  CV/Blood loss: Hemoglobin 12.5 this morning, stable from preop.  Hemodynamically stable  PLAN: Weightbearing: WBAT RLE ROM: Okay for knee and ankle range of motion as tolerated Incisional and dressing care: Reinforce dressings as needed  Showering: Okay to begin showering getting incisions wet 03/26/2022 Orthopedic device(s): CAM boot RLE when OOB Pain management:  1. Tylenol 650 mg q 6 hours scheduled 2. Robaxin 500 mg q 6 hours PRN 3. Oxycodone 5 mg q 4 hours PRN 4. Morphine 2 mg q 2 hours PRN 5. Suboxone 8-2 mg daily VTE prophylaxis: Lovenox, SCDs ID:  Ancef 2gm post op Foley/Lines:  No foley, KVO IVFs Impediments to Fracture Healing: Vitamin D level pending, will start supplementation as indicated Dispo: PT/OT evaluation ongoing, currently recommending SNF.  Plan to remove dressing RLE 03/25/2022.  Follow-up on vitamin D level  D/C recommendations: -Suboxone, Oxycodone, and Robaxin for pain control -Aspirin 325 mg daily for DVT prophylaxis -Possible need for Vit D supplementation  Follow - up plan: 2 weeks   Contact information:  Katha Hamming MD, Rushie Nyhan PA-C. After hours and holidays please check [email or web address] for group call information for [facility]   Gwinda Passe, PA-C [phone] (office) [email or web address]

## 2022-03-24 NOTE — NC FL2 (Signed)
[place] LEVEL OF CARE SCREENING TOOL     IDENTIFICATION  Patient Name: QUINNIE BARCELO Birthdate: 19-May-1956 Sex: female Admission Date (Current Location): 03/20/2022  [facility] and [place] Number:  Herbalist and Address:  [facility] [place] [address], [place], [place] [address]      Provider Number: [id number]  Attending Physician Name and Address:  Lavina Hamman, MD  Relative Name and Phone Number:  Burgueno,Lindsey Daughter [phone]    Current Level of Care: Hospital Recommended Level of Care: [place] Prior Approval Number:    Date Approved/Denied:   PASRR Number:    Discharge Plan: SNF    Current Diagnoses: Patient Active Problem List   Diagnosis Date Noted   Acute respiratory failure with hypoxia ([place]) 38/45/3646   Acute metabolic encephalopathy 80/32/1224   Traumatic closed displaced fracture of rib on left side 03/20/2022   Closed tibia fracture 03/20/2022   Fibula fracture 03/20/2022   Anxiety 03/20/2022   Fall at home, initial encounter 03/20/2022   Tobacco use disorder 03/20/2022   Elevated troponin 03/20/2022    Orientation RESPIRATION BLADDER Height & Weight     Self, Time, Situation, Place  Normal Incontinent, External catheter Weight: 85 lb (38.6 kg) Height:  5\' 2"  (157.5 cm)  BEHAVIORAL SYMPTOMS/MOOD NEUROLOGICAL BOWEL NUTRITION STATUS      Continent Diet (see discharge summary)  AMBULATORY STATUS COMMUNICATION OF NEEDS Skin   Limited Assist Verbally Skin abrasions, Other (Comment) (ecchymosis)                       Personal Care Assistance Level of Assistance  Bathing, Feeding, Dressing Bathing Assistance: Limited assistance Feeding assistance: Independent Dressing Assistance: Limited assistance     Functional Limitations Info  Sight, Hearing, Speech Sight Info: Adequate Hearing Info: Adequate Speech Info: Adequate    SPECIAL CARE FACTORS FREQUENCY  PT (By  licensed PT), OT (By licensed OT)     PT Frequency: 5x week OT Frequency: 5x week            Contractures Contractures Info: Not present    Additional Factors Info  Code Status, Allergies Code Status Info: full Allergies Info: NKA           Current Medications (03/24/2022):  This is the current hospital active medication list Current Facility-Administered Medications  Medication Dose Route Frequency Provider Last Rate Last Admin   acetaminophen (TYLENOL) tablet 650 mg  650 mg Oral Q6H Rushie Nyhan A, PA-C   650 mg at 03/24/22 1134   Or   acetaminophen (TYLENOL) suppository 650 mg  650 mg Rectal Q6H McClung, Sarah A, PA-C       buprenorphine-naloxone (SUBOXONE) 8-2 mg per SL tablet 1 tablet  1 tablet Sublingual Daily Lavina Hamman, MD   1 tablet at 03/24/22 8250   ceFAZolin (ANCEF) IVPB 2g/100 mL premix  2 g Intravenous Q8H Rushie Nyhan A, PA-C 200 mL/hr at 03/24/22 0612 2 g at 03/24/22 0612   Chlorhexidine Gluconate Cloth 2 % PADS 6 each  6 each Topical Q0600 Corinne Ports, PA-C   6 each at 03/24/22 0370   diazepam (VALIUM) tablet 2 mg  2 mg Oral Q8H PRN Corinne Ports, PA-C       docusate sodium (COLACE) capsule 100 mg  100 mg Oral BID Rushie Nyhan A, PA-C   100 mg at 03/24/22 0812   enoxaparin (LOVENOX) injection 40 mg  40 mg  Subcutaneous Q24H West Bali, PA-C   40 mg at 03/24/22 0815   feeding supplement (ENSURE ENLIVE / ENSURE PLUS) liquid 237 mL  237 mL Oral BID BM Rolly Salter, MD   237 mL at 03/24/22 0930   ferrous fumarate-b12-vitamic C-folic acid (TRINSICON / FOLTRIN) capsule 1 capsule  1 capsule Oral BID PC Rolly Salter, MD       ipratropium-albuterol (DUONEB) 0.5-2.5 (3) MG/3ML nebulizer solution 3 mL  3 mL Nebulization Q6H PRN Sharon Seller, Sarah A, PA-C       lidocaine (LIDODERM) 5 % 1 patch  1 patch Transdermal Q24H West Bali, PA-C   1 patch at 03/24/22 0813   methocarbamol (ROBAXIN) tablet 500 mg  500 mg Oral Q6H PRN West Bali, PA-C    500 mg at 03/24/22 2694   Or   methocarbamol (ROBAXIN) 500 mg in dextrose 5 % 50 mL IVPB  500 mg Intravenous Q6H PRN West Bali, PA-C       mometasone-formoterol (DULERA) 200-5 MCG/ACT inhaler 2 puff  2 puff Inhalation BID West Bali, PA-C   2 puff at 03/24/22 8546   morphine (PF) 2 MG/ML injection 2 mg  2 mg Intravenous Q2H PRN Thyra Breed A, PA-C       multivitamin with minerals tablet 1 tablet  1 tablet Oral Daily Rolly Salter, MD   1 tablet at 03/24/22 2703   mupirocin ointment (BACTROBAN) 2 % 1 Application  1 Application Nasal BID West Bali, PA-C   1 Application at 03/23/22 2212   nicotine (NICODERM CQ - dosed in mg/24 hours) patch 21 mg  21 mg Transdermal Daily Thyra Breed A, PA-C   21 mg at 03/24/22 0815   ondansetron (ZOFRAN) tablet 4 mg  4 mg Oral Q6H PRN West Bali, PA-C       Or   ondansetron (ZOFRAN) injection 4 mg  4 mg Intravenous Q6H PRN Thyra Breed A, PA-C       oxyCODONE (Oxy IR/ROXICODONE) immediate release tablet 5 mg  5 mg Oral Q4H PRN West Bali, PA-C   5 mg at 03/24/22 5009   polyethylene glycol (MIRALAX / GLYCOLAX) packet 17 g  17 g Oral Daily PRN West Bali, PA-C       umeclidinium bromide (INCRUSE ELLIPTA) 62.5 MCG/ACT 1 puff  1 puff Inhalation Daily West Bali, PA-C   1 puff at 03/24/22 3818     Discharge Medications: Please see discharge summary for a list of discharge medications.  Relevant Imaging Results:  Relevant Lab Results:   Additional Information SSN: [id number]  Lorri Frederick, LCSW

## 2022-03-24 NOTE — Evaluation (Signed)
Occupational Therapy Evaluation Patient Details Name: Valerie Bradley MRN: [id number] DOB: 1956-04-02 Today's Date: 03/24/2022   History of Present Illness 66 y.o. female presents to [facility] hospital on 03/20/2022 after a fall, with likely overdose. Pt found to have R distal tibia fx and minimally displaced fibula fx. Chest x-ray with fractures of 5 ribs on left side. Pt underwent IM nailing of RLE on 03/23/2022. PMH includes anxiety, OA, opiate abuse.   Clinical Impression   PTA, pt was living alone and was independent. Pt currently requiring Min A for LB ADLs and functional mobility with RW. Pt presenting with decreased balance and activity tolerance. Pt would benefit from further acute OT to facilitate safe dc. Recommend dc to SNF for further OT to optimize safety, independence with ADLs, and return to PLOF.       Recommendations for follow up therapy are one component of a multi-disciplinary discharge planning process, led by the attending physician.  Recommendations may be updated based on patient status, additional functional criteria and insurance authorization.   Follow Up Recommendations  Skilled nursing-short term rehab (<3 hours/day)    Assistance Recommended at Discharge Frequent or constant Supervision/Assistance  Patient can return home with the following      Functional Status Assessment  Patient has had a recent decline in their functional status and demonstrates the ability to make significant improvements in function in a reasonable and predictable amount of time.  Equipment Recommendations  BSC/3in1    Recommendations for Other Services       Precautions / Restrictions Precautions Precautions: Fall Restrictions Weight Bearing Restrictions: Yes RLE Weight Bearing: Weight bearing as tolerated      Mobility Bed Mobility Overal bed mobility: Needs Assistance Bed Mobility: Supine to Sit     Supine to sit: Min guard     General bed mobility comments: Min Guard A for  safety    Transfers Overall transfer level: Needs assistance Equipment used: Rolling walker (2 wheels) Transfers: Sit to/from Stand Sit to Stand: Min assist           General transfer comment: Min A for gaining balance      Balance Overall balance assessment: Needs assistance Sitting-balance support: No upper extremity supported, Feet supported Sitting balance-Leahy Scale: Good     Standing balance support: Bilateral upper extremity supported, During functional activity Standing balance-Leahy Scale: Poor                             ADL either performed or assessed with clinical judgement   ADL Overall ADL's : Needs assistance/impaired Eating/Feeding: Set up;Sitting   Grooming: Set up;Sitting   Upper Body Bathing: Set up;Supervision/ safety;Sitting   Lower Body Bathing: Minimal assistance;Sit to/from stand   Upper Body Dressing : Set up;Supervision/safety;Sitting   Lower Body Dressing: Minimal assistance;Sit to/from stand Lower Body Dressing Details (indicate cue type and reason): Able to adjust socks both in long sitting and sitting EOB Toilet Transfer: Minimal assistance;Ambulation;Rolling walker (2 wheels) Toilet Transfer Details (indicate cue type and reason): Min A for gaining balance in standing         Functional mobility during ADLs: Minimal assistance;Rolling walker (2 wheels) General ADL Comments: Pt presenting with decreased activity tolerance due to pain     Vision         Perception     Praxis      Pertinent Vitals/Pain Pain Assessment Pain Assessment: 0-10 Pain Score: 4  Pain Location: RLE  at rest Pain Descriptors / Indicators: Sore Pain Intervention(s): Monitored during session, Repositioned, Limited activity within patient's tolerance     Hand Dominance     Extremity/Trunk Assessment Upper Extremity Assessment Upper Extremity Assessment: Overall WFL for tasks assessed   Lower Extremity Assessment Lower Extremity  Assessment: RLE deficits/detail   Cervical / Trunk Assessment Cervical / Trunk Assessment: Kyphotic   Communication Communication Communication: No difficulties   Cognition Arousal/Alertness: Awake/alert Behavior During Therapy: WFL for tasks assessed/performed Overall Cognitive Status: Impaired/Different from baseline Area of Impairment: Memory                     Memory: Decreased short-term memory         General Comments: Requiring cues for recall     General Comments  VSS on RA. Son arriving at end of session    Exercises     Shoulder Instructions      Home Living Family/patient expects to be discharged to:: Skilled nursing facility Living Arrangements: Alone Available Help at Discharge: Family;Available PRN/intermittently Type of Home: House Home Access: Stairs to enter [employer] of Steps: 4 Entrance Stairs-Rails: None Home Layout: One level               Home Equipment: None          Prior Functioning/Environment Prior Level of Function : Independent/Modified Independent;Driving                        OT Problem List: Decreased strength;Decreased activity tolerance;Impaired balance (sitting and/or standing);Decreased knowledge of use of DME or AE;Decreased knowledge of precautions      OT Treatment/Interventions: Therapeutic exercise;Self-care/ADL training;Energy conservation;DME and/or AE instruction;Therapeutic activities;Patient/family education    OT Goals(Current goals can be found in the care plan section) Acute Rehab OT Goals Patient Stated Goal: Go to rehab and get stronger OT Goal Formulation: With patient Time For Goal Achievement: 04/07/22 Potential to Achieve Goals: Good  OT Frequency: Min 2X/week    Co-evaluation              AM-PAC OT "6 Clicks" Daily Activity     Outcome Measure Help from another person eating meals?: None Help from another person taking care of personal grooming?: A  Little Help from another person toileting, which includes using toliet, bedpan, or urinal?: A Little Help from another person bathing (including washing, rinsing, drying)?: A Little Help from another person to put on and taking off regular upper body clothing?: A Little Help from another person to put on and taking off regular lower body clothing?: A Little 6 Click Score: 19   End of Session Equipment Utilized During Treatment: Rolling walker (2 wheels);Gait belt Nurse Communication: Mobility status;Weight bearing status  Activity Tolerance: Patient tolerated treatment well Patient left: in chair;with call bell/phone within reach;with chair alarm set;with family/visitor present  OT Visit Diagnosis: Unsteadiness on feet (R26.81);Other abnormalities of gait and mobility (R26.89);Muscle weakness (generalized) (M62.81)                Time: 7510-2585 OT Time Calculation (min): 26 min Charges:  OT General Charges $OT Visit: 1 Visit OT Evaluation $OT Eval Low Complexity: 1 Low OT Treatments $Self Care/Home Management : 8-22 mins  Tya Haughey MSOT, OTR/L Acute Rehab Office: [phone]  Theodoro Grist Chang Tiggs 03/24/2022, 3:13 PM

## 2022-03-24 NOTE — Plan of Care (Signed)
  Problem: Clinical Measurements: Goal: Will remain free from infection Outcome: Progressing   Problem: Activity: Goal: Risk for activity intolerance will decrease Outcome: Progressing   Problem: Safety: Goal: Ability to remain free from injury will improve Outcome: Progressing   

## 2022-03-24 NOTE — Progress Notes (Signed)
Orthopedic Tech Progress Note Patient Details:  Valerie Bradley June 20, 1956 [id number]  CAM walker was taken by Thurmond Butts (PT) as he was about to work with the pt. He applied the CAM walker.  Ortho Devices Type of Ortho Device: CAM walker Ortho Device/Splint Location: taken by Thurmond Butts, PT and applied to pt Ortho Device/Splint Interventions: Ordered   Post Interventions Instructions Provided: Adjustment of device, Care of device  Elvis Boot Jeri Modena 03/24/2022, 6:37 PM

## 2022-03-25 ENCOUNTER — Encounter (HOSPITAL_COMMUNITY): Payer: Self-pay | Admitting: Student

## 2022-03-25 DIAGNOSIS — J9601 Acute respiratory failure with hypoxia: Secondary | ICD-10-CM | POA: Diagnosis not present

## 2022-03-25 LAB — COMPREHENSIVE METABOLIC PANEL
ALT: 33 U/L (ref 0–44)
AST: 28 U/L (ref 15–41)
Albumin: 2.5 g/dL — ABNORMAL LOW (ref 3.5–5.0)
Alkaline Phosphatase: 67 U/L (ref 38–126)
Anion gap: 10 (ref 5–15)
BUN: 17 mg/dL (ref 8–23)
CO2: 30 mmol/L (ref 22–32)
Calcium: 8.4 mg/dL — ABNORMAL LOW (ref 8.9–10.3)
Chloride: 97 mmol/L — ABNORMAL LOW (ref 98–111)
Creatinine, Ser: 1.09 mg/dL — ABNORMAL HIGH (ref 0.44–1.00)
GFR, Estimated: 56 mL/min — ABNORMAL LOW (ref >60)
Glucose, Bld: 162 mg/dL — ABNORMAL HIGH (ref 70–99)
Potassium: 3.4 mmol/L — ABNORMAL LOW (ref 3.5–5.1)
Sodium: 137 mmol/L (ref 135–145)
Total Bilirubin: 0.8 mg/dL (ref 0.3–1.2)
Total Protein: 4.5 g/dL — ABNORMAL LOW (ref 6.5–8.1)

## 2022-03-25 LAB — CBC
HCT: 31.8 % — ABNORMAL LOW (ref 36.0–46.0)
HCT: 34 % — ABNORMAL LOW (ref 36.0–46.0)
Hemoglobin: 10.8 g/dL — ABNORMAL LOW (ref 12.0–15.0)
Hemoglobin: 11.4 g/dL — ABNORMAL LOW (ref 12.0–15.0)
MCH: 34.6 pg — ABNORMAL HIGH (ref 26.0–34.0)
MCH: 34.5 pg — ABNORMAL HIGH (ref 26.0–34.0)
MCHC: 34 g/dL (ref 30.0–36.0)
MCHC: 33.5 g/dL (ref 30.0–36.0)
MCV: 101.9 fL — ABNORMAL HIGH (ref 80.0–100.0)
MCV: 103 fL — ABNORMAL HIGH (ref 80.0–100.0)
Platelets: 99 10*3/uL — ABNORMAL LOW (ref 150–400)
Platelets: 107 10*3/uL — ABNORMAL LOW (ref 150–400)
RBC: 3.12 MIL/uL — ABNORMAL LOW (ref 3.87–5.11)
RBC: 3.3 MIL/uL — ABNORMAL LOW (ref 3.87–5.11)
RDW: 14.6 % (ref 11.5–15.5)
RDW: 14.8 % (ref 11.5–15.5)
WBC: 7.1 10*3/uL (ref 4.0–10.5)
WBC: 7.6 10*3/uL (ref 4.0–10.5)
nRBC: 0 % (ref 0.0–0.2)
nRBC: 0 % (ref 0.0–0.2)

## 2022-03-25 LAB — MAGNESIUM: Magnesium: 1.9 mg/dL (ref 1.7–2.4)

## 2022-03-25 MED ORDER — NICOTINE 21 MG/24HR TD PT24: 21.0000 mg | MEDICATED_PATCH | Freq: Every day | TRANSDERMAL | 0 refills | Status: AC

## 2022-03-25 MED ORDER — MOMETASONE FURO-FORMOTEROL FUM 200-5 MCG/ACT IN AERO: 2.0000 | INHALATION_SPRAY | Freq: Two times a day (BID) | RESPIRATORY_TRACT | 0 refills | Status: AC

## 2022-03-25 MED ORDER — ENSURE ENLIVE PO LIQD: 237.0000 mL | Freq: Three times a day (TID) | ORAL | 0 refills | Status: AC

## 2022-03-25 MED ORDER — POTASSIUM CHLORIDE CRYS ER 20 MEQ PO TBCR
40.0000 meq | EXTENDED_RELEASE_TABLET | Freq: Once | ORAL | Status: AC
  Administered 2022-03-25: 40 meq via ORAL
  Filled 2022-03-25: qty 2

## 2022-03-25 MED ORDER — VITAMIN B-12 1000 MCG PO TABS: 1000.0000 ug | ORAL_TABLET | Freq: Every day | ORAL | 0 refills | Status: AC

## 2022-03-25 MED ORDER — ADULT MULTIVITAMIN W/MINERALS CH: 1.0000 | ORAL_TABLET | Freq: Every day | ORAL | 0 refills | Status: AC

## 2022-03-25 MED ORDER — VITAMIN D (ERGOCALCIFEROL) 1.25 MG (50000 UNIT) PO CAPS: 50000.0000 [IU] | ORAL_CAPSULE | ORAL | 0 refills | Status: AC

## 2022-03-25 MED ORDER — UMECLIDINIUM BROMIDE 62.5 MCG/ACT IN AEPB: 1.0000 | INHALATION_SPRAY | Freq: Every day | RESPIRATORY_TRACT | 0 refills | Status: AC

## 2022-03-25 MED ORDER — BUPRENORPHINE HCL-NALOXONE HCL 8-2 MG SL SUBL: 1.0000 | SUBLINGUAL_TABLET | Freq: Every day | SUBLINGUAL | 0 refills | Status: AC

## 2022-03-25 MED ORDER — DOCUSATE SODIUM 100 MG PO CAPS: 100.0000 mg | ORAL_CAPSULE | Freq: Two times a day (BID) | ORAL | 0 refills | Status: AC

## 2022-03-25 MED ORDER — POLYETHYLENE GLYCOL 3350 17 G PO PACK: 17.0000 g | PACK | Freq: Every day | ORAL | 0 refills | Status: AC | PRN

## 2022-03-25 MED ORDER — OXYCODONE HCL 5 MG PO TABS: 5.0000 mg | ORAL_TABLET | Freq: Four times a day (QID) | ORAL | 0 refills | Status: AC | PRN

## 2022-03-25 MED ORDER — GABAPENTIN 100 MG PO CAPS
100.0000 mg | ORAL_CAPSULE | Freq: Two times a day (BID) | ORAL | Status: DC
  Administered 2022-03-25 – 2022-03-26 (×3): 100 mg via ORAL
  Filled 2022-03-25 (×3): qty 1

## 2022-03-25 MED ORDER — FERROUS GLUCONATE 324 (38 FE) MG PO TABS: 324.0000 mg | ORAL_TABLET | Freq: Every day | ORAL | 0 refills | Status: AC

## 2022-03-25 MED ORDER — VITAMIN D (ERGOCALCIFEROL) 1.25 MG (50000 UNIT) PO CAPS
50000.0000 [IU] | ORAL_CAPSULE | ORAL | Status: DC
  Administered 2022-03-25: 50000 [IU] via ORAL
  Filled 2022-03-25: qty 1

## 2022-03-25 MED ORDER — LIDOCAINE 5 % EX PTCH: 1.0000 | MEDICATED_PATCH | CUTANEOUS | 0 refills | Status: AC

## 2022-03-25 MED ORDER — GABAPENTIN 100 MG PO CAPS: 100.0000 mg | ORAL_CAPSULE | Freq: Two times a day (BID) | ORAL | 0 refills | Status: DC

## 2022-03-25 MED ORDER — FOLIC ACID 1 MG PO TABS: 1.0000 mg | ORAL_TABLET | Freq: Every day | ORAL | 0 refills | Status: AC

## 2022-03-25 MED ORDER — METHOCARBAMOL 500 MG PO TABS: 500.0000 mg | ORAL_TABLET | Freq: Four times a day (QID) | ORAL | 0 refills | Status: AC | PRN

## 2022-03-25 MED ORDER — ASPIRIN 325 MG PO TBEC: 325.0000 mg | DELAYED_RELEASE_TABLET | Freq: Every day | ORAL | 0 refills | Status: AC

## 2022-03-25 NOTE — Progress Notes (Signed)
Mobility Specialist Progress Note   03/25/22 1050  Mobility  Activity Ambulated with assistance in hallway  Activity Response Tolerated well  Distance Ambulated (ft) 86 ft  $Mobility charge 1 Mobility  Level of Assistance Contact guard assist, steadying assist  Assistive Device Front wheel walker  RLE Weight Bearing WBAT   Pre Mobility: 86 HR, 112/66 BP, 94% SpO2 During Mobility: 122 HR, 91% SpO2 Post Mobility: 94 HR, 115/63 BP, 94% SpO2  Received in bed c/o sharp pain in R knee (7/10) but agreeable to mobility. Requiring no physical assistance to get EOB but needing contact guard d/t slight LOB upon standing. X2 standing rest breaks d/t fatigue w/ mod cues for correction in posture but no faults throughout. Returned back to bed w/ call bell in reach, all needs met and bed alarm on.    Holland Falling Mobility Specialist [place] #:  [phone] Acute Rehab Office:  [phone]

## 2022-03-25 NOTE — TOC Progression Note (Addendum)
Transition of Care [facility]) - Progression Note    Patient Details  Name: Valerie Bradley MRN: [id number] Date of Birth: 20-Nov-1955  [facility]) CM/SW Contact  Joanne Chars, LCSW Phone Number: 03/25/2022, 12:30 PM  Clinical Narrative:   [facility] Blight, [facility].  They cannot make bed offer.  TC son Darnelle Maffucci, updated on no offer from [facility].  His next choice would be [facility], (admissions: [place]) [phone], fax: [phone].  Also would consider [facility], (admissions: [place]) [phone], fax: [phone].  Does not want [facility].  He would also consider [facility] in [place] as back up option.  CSW called and LM at the two [place] facilities, faxed referrals.    Referral sent to [facility] as well.  1500: TC Angela, [place].  (Cell: [phone] will review referral and could potentially do Saturday if admission is approved by her nursing staff.  Will call back before end of day today if she has information.  PASSR received: [id number].    Expected Discharge Plan: [place] Barriers to Discharge: Continued Medical Work up, Ship broker, SNF Pending bed offer  Expected Discharge Plan and Services Expected Discharge Plan: [place] Choice: [place] arrangements for the past 2 months: Single Family Home                                       Social Determinants of Health (SDOH) Interventions    Readmission Risk Interventions     No data to display

## 2022-03-25 NOTE — Progress Notes (Addendum)
Orthopaedic Trauma Progress Note  SUBJECTIVE: Doing fairly well this morning.  Notes pain is well controlled. Likes the compression of the ace wrap on her leg. No chest pain. No SOB. No nausea/vomiting. No other complaints.  Therapies are going well. Potassium low this AM, being replaced per primary team.  OBJECTIVE:  Vitals:   03/24/22 2022 03/24/22 2158  BP: (!) 100/52 (!) 110/49  Pulse:    Resp:    Temp:  98.6 F (37 C)  SpO2:      General: Sitting up in bed, no acute distress Respiratory: No increased work of breathing.  RLE: Dressing clean, dry, intact.  Ace wrap left in place per patient's request. Tenderness to the tibia as expected.  Tolerates gentle knee range of motion.  Ankle DF/PF intact.  Compartment soft and compressible. + EHL/FHL.  Endorses sensation throughout extremity.  Neurovascularly intact  IMAGING: Stable post op imaging.   LABS:  Results for orders placed or performed during the hospital encounter of 03/20/22 (from the past 24 hour(s))  CBC     Status: Abnormal   Collection Time: 03/25/22  2:47 AM  Result Value Ref Range   WBC 7.1 4.0 - 10.5 K/uL   RBC 3.12 (L) 3.87 - 5.11 MIL/uL   Hemoglobin 10.8 (L) 12.0 - 15.0 g/dL   HCT 15.1 (L) 76.1 - 60.7 %   MCV 101.9 (H) 80.0 - 100.0 fL   MCH 34.6 (H) 26.0 - 34.0 pg   MCHC 34.0 30.0 - 36.0 g/dL   RDW 37.1 06.2 - 69.4 %   Platelets 99 (L) 150 - 400 K/uL   nRBC 0.0 0.0 - 0.2 %  Magnesium     Status: None   Collection Time: 03/25/22  2:47 AM  Result Value Ref Range   Magnesium 1.9 1.7 - 2.4 mg/dL  Comprehensive metabolic panel     Status: Abnormal   Collection Time: 03/25/22  2:47 AM  Result Value Ref Range   Sodium 137 135 - 145 mmol/L   Potassium 3.4 (L) 3.5 - 5.1 mmol/L   Chloride 97 (L) 98 - 111 mmol/L   CO2 30 22 - 32 mmol/L   Glucose, Bld 162 (H) 70 - 99 mg/dL   BUN 17 8 - 23 mg/dL   Creatinine, Ser 8.54 (H) 0.44 - 1.00 mg/dL   Calcium 8.4 (L) 8.9 - 10.3 mg/dL   Total Protein 4.5 (L) 6.5 - 8.1 g/dL    Albumin 2.5 (L) 3.5 - 5.0 g/dL   AST 28 15 - 41 U/L   ALT 33 0 - 44 U/L   Alkaline Phosphatase 67 38 - 126 U/L   Total Bilirubin 0.8 0.3 - 1.2 mg/dL   GFR, Estimated 56 (L) >60 mL/min   Anion gap 10 5 - 15    ASSESSMENT: Valerie Bradley is a 66 y.o. female, 2 Days Post-Op s/p INTRAMEDULLARY NAILING TIBIA FRACTURE RIGHT  CV/Blood loss: Hemoglobin 11.4 this morning, slight drop but overall stable from preop.  Hemodynamically stable  PLAN: Weightbearing: WBAT RLE ROM: Okay for knee and ankle range of motion as tolerated Incisional and dressing care: Ok for incisions to be left open to air. Showering: Okay to begin showering getting incisions wet 03/26/2022 Orthopedic device(s): CAM boot RLE when OOB Pain management:  1. Tylenol 650 mg q 6 hours scheduled 2. Robaxin 500 mg q 6 hours PRN 3. Oxycodone 5 mg q 4 hours PRN 4. Morphine 2 mg q 2 hours PRN 5. Suboxone 8-2 mg daily  VTE prophylaxis: Lovenox, SCDs ID:  Ancef 2gm post op completed Foley/Lines:  No foley, KVO IVFs Impediments to Fracture Healing: Vitamin D level 6, started on D2 supplementation  Dispo: PT/OT recommending SNF. TOC following for placement. Patient stable for d/c from ortho standpoint once cleared by medicine team and therapies.   D/C recommendations: -Suboxone, Oxycodone, and Robaxin for pain control -Aspirin 325 mg daily for DVT prophylaxis -Continue 50,000 units Vit D supplementation q 7 days x 8 weeks  Follow - up plan: 2 weeks after d/c for wound check and repeat x-rays   Contact information:  Katha Hamming MD, Rushie Nyhan PA-C. After hours and holidays please check [email or web address] for group call information for [facility]   Gwinda Passe, PA-C [phone] (office) [email or web address]

## 2022-03-25 NOTE — Plan of Care (Signed)
  Problem: Clinical Measurements: Goal: Will remain free from infection Outcome: Progressing   Problem: Safety: Goal: Ability to remain free from injury will improve Outcome: Progressing   Problem: Clinical Measurements: Goal: Will remain free from infection Outcome: Progressing

## 2022-03-25 NOTE — Discharge Summary (Signed)
Physician Discharge Summary   Patient: Valerie Bradley MRN: [id number] DOB: [birth year]  Admit date:     03/20/2022  Discharge date: 03/25/22  Discharge Physician: Lynden Oxford  PCP: Pcp, No  Recommendations at discharge:  Follow up as recommended   Follow-up Information     PCP. Schedule an appointment as soon as possible for a visit in 1 week(s).          Haddix, Gillie Manners, MD. Schedule an appointment as soon as possible for a visit in 2 week(s).   Specialty: Orthopedic Surgery Why: For wound re-check Contact information: [address] [place] [address] [phone]         Pain management provider. Schedule an appointment as soon as possible for a visit in 1 week(s).   Why: discuss management of medication, pt is off of valuim and pt is only taking  suboxone once a day.               Discharge Diagnoses: Principal Problem:   Acute respiratory failure with hypoxia (HCC) Active Problems:   Acute metabolic encephalopathy   Traumatic closed displaced fracture of rib on left side   Closed tibia fracture   Fibula fracture   Anxiety   Fall at home, initial encounter   Tobacco use disorder   Elevated troponin  Hospital Course: 66 year old female with a history of anxiety on Valium, was brought to the hospital after a fall.  She was noted to be lethargic when she was found on the floor.  Work-up in the emergency room showed that she did have several rib fractures in the left chest, also noted to have a right tibia/fibula fracture.  She was noted to be hypoxic and hypercapnic and was initially placed on BiPAP.  Further imaging did indicate some volume overload.  EDP discussed case with trauma service as well as orthopedics at [facility].  It was recommended patient be admitted to [facility] where these services can consult on her.  Assessment and Plan  Acute respiratory failure with hypoxia and hypercapnia/undiagnosed underlying COPD with atelectasis: -Possibly  related to hypoventilation secondary to medications On admission, she was hypoxic and hypercapnic.  She was placed on BiPAP.  Currently she is on 4 to 5 L oxygen.  Lungs clear to auscultation with some diminished breath sounds at the bases.  She has been smoking 1-1/2 pack/day for several years.  Likely has COPD which is not diagnosed yet.  Currently on room air.   Closed right tibia and fibula fracture ED discussed with orthopedics, Dr. Renaye Rakers Underwent intramedullary implant.  PT OT recommends SNF. Monitor.   Left-sided rib fractures, 7th-12th ribs Secondary to fall ED discussed with trauma service, continue incentive spirometry  Recommended conservative measures. Monitor.   Elevated troponin:  Likely demand ischemia and trauma related.  Echo negative for wall motion abnormality.   AKI: Initially resolved, worsen again and now better.    Elevated LFTs: Improving. Right upper quadrant ultrasound does not show any acute obstructive process Hepatitis panel negative.   Fall at home Patient was found down Circumstances of fall are not entirely clear   Anxiety Pt on 10 mg valium TID, she thinks she may have taken more than prescribed meds.  For now tolerating being off of valium, will continue to hold it and add gabapentin.    Chronic pain. Patient is PRESCRIBED Suboxone 3 times daily. But she has weaned herself off of it to only 1 SUBOXONE / DAY.  Continue  the same.   Acute metabolic/toxic encephalopathy Patient was found down and was lethargic Possibly related to benzodiazepine overuse She was also noted to have elevated PCO2, but this may be more of a chronic finding Ammonia level normal CT head without acute abnormalities Overall mental status does appear to be improving, currently she is fully alert and oriented. Tolerating reduction of Suboxone.   Underweight. Failure to thrive in adult. Body mass index is 15.55 kg/m. Nutrition Problem: Increased nutrient  needs Etiology: post-op healing Nutrition Interventions: Interventions: Ensure Enlive (each supplement provides 350kcal and 20 grams of protein), MVI   Concern for acute pulmonary edema: Ruled out. here is no evidence of pulmonary edema, chest x-ray negative for that, no BNP was obtained.  She does not have crackles.  Pulmonary edema ruled out.  Echo is also not showing any diastolic or systolic congestive heart failure.  She [facility] at [facility].  Pain control - [place] Controlled Substance Reporting System database was reviewed. and patient was instructed, not to drive, operate heavy machinery, perform activities at heights, swimming or participation in water activities or provide baby-sitting services while on Pain, Sleep and Anxiety Medications; until their outpatient Physician has advised to do so again. Also recommended to not to take more than prescribed Pain, Sleep and Anxiety Medications.  Consultants:  Orthopedics   Procedures performed:  Intramedullary nailing of right tibia fracture  DISCHARGE MEDICATION: Allergies as of 03/25/2022   Not on File      Medication List     STOP taking these medications    diazepam 10 MG tablet Commonly known as: VALIUM       TAKE these medications    aspirin EC 325 MG tablet Take 1 tablet (325 mg total) by mouth daily.   buprenorphine-naloxone 8-2 mg Subl SL tablet Commonly known as: SUBOXONE Place 1 tablet under the tongue daily for 5 days. What changed: when to take this   cyanocobalamin 1000 MCG tablet Commonly known as: VITAMIN B12 Take 1 tablet (1,000 mcg total) by mouth daily.   docusate sodium 100 MG capsule Commonly known as: COLACE Take 1 capsule (100 mg total) by mouth 2 (two) times daily.   feeding supplement Liqd Take 237 mLs by mouth 3 (three) times daily between meals.   ferrous gluconate 324 MG tablet Commonly known as: FERGON Take 1 tablet (324 mg total) by mouth daily with breakfast.    folic acid 1 MG tablet Commonly known as: FOLVITE Take 1 tablet (1 mg total) by mouth daily.   gabapentin 100 MG capsule Commonly known as: NEURONTIN Take 1 capsule (100 mg total) by mouth 2 (two) times daily.   lidocaine 5 % Commonly known as: LIDODERM Place 1 patch onto the skin daily for 13 days. Remove & Discard patch within 12 hours or as directed by MD Start taking on: March 26, 2022   methocarbamol 500 MG tablet Commonly known as: ROBAXIN Take 1 tablet (500 mg total) by mouth every 6 (six) hours as needed for muscle spasms.   mometasone-formoterol 200-5 MCG/ACT Aero Commonly known as: DULERA Inhale 2 puffs into the lungs 2 (two) times daily.   multivitamin with minerals Tabs tablet Take 1 tablet by mouth daily. Start taking on: March 26, 2022   nicotine 21 mg/24hr patch Commonly known as: NICODERM CQ - dosed in mg/24 hours Place 1 patch (21 mg total) onto the skin daily. Start taking on: March 26, 2022   oxyCODONE 5 MG immediate release tablet Commonly  known as: Oxy IR/ROXICODONE Take 1 tablet (5 mg total) by mouth every 6 (six) hours as needed for severe pain.   polyethylene glycol 17 g packet Commonly known as: MIRALAX / GLYCOLAX Take 17 g by mouth daily as needed for mild constipation.   umeclidinium bromide 62.5 MCG/ACT Aepb Commonly known as: INCRUSE ELLIPTA Inhale 1 puff into the lungs daily. Start taking on: March 26, 2022   Vitamin D (Ergocalciferol) 1.25 MG (50000 UNIT) Caps capsule Commonly known as: DRISDOL Take 1 capsule (50,000 Units total) by mouth every 7 (seven) days.               Discharge Care Instructions  (From admission, onward)           Start     Ordered   03/25/22 0000  Leave dressing on - Keep it clean, dry, and intact until clinic visit        03/25/22 1341           Disposition: SNF Diet recommendation: Regular diet  Discharge Exam: Vitals:   03/24/22 1324 03/24/22 2022 03/24/22 2158 03/25/22 1017   BP: (!) 100/54 (!) 100/52 (!) 110/49 (!) 98/52  Pulse: 91   95  Resp: 18   18  Temp: 98.7 F (37.1 C)  98.6 F (37 C) 98 F (36.7 C)  TempSrc:   Oral Oral  SpO2: 97%   (!) 89%  Weight:      Height:       General: Appear in no distress; no visible Abnormal Neck Mass Or lumps, Conjunctiva normal Cardiovascular: S1 and S2 Present, no Murmur, Respiratory: good respiratory effort, Bilateral Air entry present and CTA, no Crackles, no wheezes Abdomen: Bowel Sound present, Non tender  Extremities: trace Pedal edema Neurology: alert and oriented to time, place, and person  Filed Weights   03/22/22 0552 03/23/22 0313 03/23/22 0957  Weight: 38.7 kg 37.3 kg 38.6 kg   Condition at discharge: stable  The results of significant diagnostics from this hospitalization (including imaging, microbiology, ancillary and laboratory) are listed below for reference.   Imaging Studies: DG Tibia/Fibula Right Port  Result Date: 03/23/2022 CLINICAL DATA:  Fracture.  Interval placement of intramedullary rod. EXAM: PORTABLE RIGHT TIBIA AND FIBULA - 2 VIEW COMPARISON:  Tibia and fibular radiographs 03/20/2022 FINDINGS: Oblique fracture of the distal tibial metaphysis is reduced. Intramedullary rod is in place with 2 proximal and 2 distal interlocking screws. Fluid gas are present in the knee joint. Minimally displaced proximal fibular fracture is stable. No new fractures are present. IMPRESSION: 1. Interval ORIF of distal tibial fracture without radiographic evidence for complication. 2. Stable minimally displaced proximal fibular fracture. Electronically Signed   By: San Morelle M.D.   On: 03/23/2022 13:50   DG Tibia/Fibula Right  Result Date: 03/23/2022 CLINICAL DATA:  Tibial nail.  Intraoperative fluoroscopy. EXAM: RIGHT TIBIA AND FIBULA - 2 VIEW COMPARISON:  Right tibia and fibula radiographs 03/20/2022 FINDINGS: Images were performed intraoperatively without the presence of a radiologist. The patient  is undergoing intramedullary nail fixation of the previously seen spiral fracture of the distal tibial diaphysis. There is improved, now anatomic alignment. Total fluoroscopy images: 6 Total fluoroscopy time: 63 seconds Total dose: Radiation Exposure Index (as provided by the fluoroscopic device): 1.21 mGy air Kerma Please see intraoperative findings for further detail. IMPRESSION: Intraoperative fluoroscopy for intramedullary nail fixation of the distal tibial fracture. Electronically Signed   By: Yvonne Kendall M.D.   On: 03/23/2022 12:37   DG  C-Arm 1-60 Min-No Report  Result Date: 03/23/2022 Fluoroscopy was utilized by the requesting physician.  No radiographic interpretation.   DG CHEST PORT 1 VIEW  Result Date: 03/22/2022 CLINICAL DATA:  Multiple rib fractures EXAM: PORTABLE CHEST 1 VIEW COMPARISON:  Portable exam 0813 hours compared to 03/20/2022 FINDINGS: Upper normal heart size. Mediastinal contours and pulmonary vascularity normal. Atherosclerotic calcification aorta. Emphysematous changes with BILATERAL lower lobe atelectasis and small RIGHT pleural effusion. No definite infiltrate or pneumothorax. Diffuse osseous demineralization. Known LEFT rib fractures by recent CT are inadequately demonstrated radiographically. IMPRESSION: COPD changes with bibasilar atelectasis and small RIGHT pleural effusion. Aortic Atherosclerosis (ICD10-I70.0) and Emphysema (ICD10-J43.9). Electronically Signed   By: Ulyses Southward M.D.   On: 03/22/2022 08:42   ECHOCARDIOGRAM COMPLETE  Result Date: 03/21/2022    ECHOCARDIOGRAM REPORT   Patient Name:   NOEMIE DEVIVO Date of Exam: 03/21/2022 Medical Rec #:  [id number]      Height:       60.0 in Accession #:    [id number]     Weight:       115.0 lb Date of Birth:  11-19-1955      BSA:          1.475 m Patient Age:    65 years       BP:           140/80 mmHg Patient Gender: F              HR:           96 bpm. [facility] Procedure: 2D Echo, Cardiac Doppler and  Color Doppler Indications:    R06.02 SOB. Acute respiratory distress.  History:        Patient has no prior history of Echocardiogram examinations.                 Signs/Symptoms:Shortness of Breath and Dyspnea; Risk                 Factors:Current Smoker. Elevated troponin.  Sonographer:    Sheralyn Boatman RDCS Referring Phys: [id number] ASIA B ZIERLE-GHOSH  Sonographer Comments: Technically difficult study due to poor echo windows, suboptimal parasternal window, suboptimal apical window and suboptimal subcostal window. Patient moving constantly during exam. Unable to obtain on- axis images. Patient could not move due to broken leg. Attempted to move from right decubitus position to sight left decubitus. Patient moaning during test. IMPRESSIONS  1. Limited windows and study. Left ventricular ejection fraction, by estimation, is 60 to 65%. The left ventricle has normal function. The left ventricle has no regional wall motion abnormalities. Left ventricular diastolic parameters are indeterminate.  2. Right ventricular systolic function is normal. The right ventricular size is not well visualized. Tricuspid regurgitation signal is inadequate for assessing PA pressure.  3. Moderate pleural effusion.  4. Mild mitral valve regurgitation.  5. The aortic valve was not well visualized. Aortic valve regurgitation is not visualized.  6. The inferior vena cava is dilated in size with <50% respiratory variability, suggesting right atrial pressure of 15 mmHg. Comparison(s): No prior Echocardiogram. Conclusion(s)/Recommendation(s): Normal biventricular function without evidence of hemodynamically significant valvular heart disease. FINDINGS  Left Ventricle: Limited windows and study. Left ventricular ejection fraction, by estimation, is 60 to 65%. The left ventricle has normal function. The left ventricle has no regional wall motion abnormalities. The left ventricular internal cavity size was normal in size. There is no left ventricular  hypertrophy. Left ventricular diastolic parameters are indeterminate.  Right Ventricle: The right ventricular size is not well visualized. Right ventricular systolic function is normal. Tricuspid regurgitation signal is inadequate for assessing PA pressure. Left Atrium: Left atrial size was normal in size. Right Atrium: Right atrial size was normal in size. Pericardium: There is no evidence of pericardial effusion. Mitral Valve: Mild mitral valve regurgitation. Tricuspid Valve: Tricuspid valve regurgitation is not demonstrated. Aortic Valve: The aortic valve was not well visualized. Aortic valve regurgitation is not visualized. Pulmonic Valve: The pulmonic valve was grossly normal. Pulmonic valve regurgitation is not visualized. Aorta: The aortic root and ascending aorta are structurally normal, with no evidence of dilitation. Venous: The inferior vena cava is dilated in size with less than 50% respiratory variability, suggesting right atrial pressure of 15 mmHg. IAS/Shunts: The interatrial septum was not well visualized. Additional Comments: There is a moderate pleural effusion.  LEFT VENTRICLE PLAX 2D LVIDd:         3.40 cm     Diastology LVIDs:         2.20 cm     LV e' medial:    5.03 cm/s LV PW:         0.90 cm     LV E/e' medial:  17.6 LV IVS:        0.90 cm     LV e' lateral:   4.46 cm/s LVOT diam:     1.80 cm     LV E/e' lateral: 19.9 LV SV:         28 LV SV Index:   19 LVOT Area:     2.54 cm  LV Volumes (MOD) LV vol d, MOD A4C: 34.0 ml LV vol s, MOD A4C: 11.5 ml LV SV MOD A4C:     34.0 ml RIGHT VENTRICLE            IVC RV S prime:     7.14 cm/s  IVC diam: 2.60 cm LEFT ATRIUM           Index        RIGHT ATRIUM           Index LA diam:      2.90 cm 1.97 cm/m   RA Area:     10.10 cm LA Vol (A4C): 19.6 ml 13.28 ml/m  RA Volume:   19.50 ml  13.22 ml/m  AORTIC VALVE             PULMONIC VALVE LVOT Vmax:   61.70 cm/s  PR End Diast Vel: 1.77 msec LVOT Vmean:  40.400 cm/s LVOT VTI:    0.111 m  AORTA Ao Root  diam: 2.60 cm Ao Asc diam:  2.90 cm MITRAL VALVE MV Area (PHT): 5.02 cm    SHUNTS MV Decel Time: 151 msec    Systemic VTI:  0.11 m MV E velocity: 88.60 cm/s  Systemic Diam: 1.80 cm MV A velocity: 60.90 cm/s MV E/A ratio:  1.45 Photographer signed by Carolan Clines Signature Date/Time: 03/21/2022/12:19:34 PM    Final    US Abdomen Limited RUQ (LIVER/GB)  Result Date: 03/21/2022 CLINICAL DATA:  Liver failure EXAM: ULTRASOUND ABDOMEN LIMITED RIGHT UPPER QUADRANT COMPARISON:  CT abdomen pelvis 03/20/2022 FINDINGS: Gallbladder: Gallstones: None Sludge: None Gallbladder Wall: Within normal limits Pericholecystic fluid: None Sonographic Murphy's Sign: Negative per technologist Common bile duct: Diameter: 3 mm Liver: Parenchymal echogenicity: Within normal limits Contours: Normal Lesions: None Portal vein: Patent.  Hepatopetal flow Other: Trace perihepatic ascites. Minimal right pleural effusion partially visualized. IMPRESSION:  1. No significant sonographic abnormality of the gallbladder or liver. 2. Trace perihepatic ascites and minimal right pleural effusion. Electronically Signed   By: Acquanetta BellingFarhaan  Mir M.D.   On: 03/21/2022 09:19   DG Tibia/Fibula Right  Result Date: 03/20/2022 CLINICAL DATA:  Known tibial and fibular fractures following reduction, initial encounter EXAM: RIGHT TIBIA AND FIBULA - 2 VIEW COMPARISON:  Film from earlier in the same day. FINDINGS: Casting material is now noted in place. Previously seen tibial fracture has been reduced somewhat. Proximal fibular fracture is again noted and stable. No new focal abnormality is noted. IMPRESSION: Slight reduction of tibial fracture site. Casting material is noted. Electronically Signed   By: Alcide CleverMark  Lukens M.D.   On: 03/20/2022 21:40   CT CHEST ABDOMEN PELVIS W CONTRAST  Result Date: 03/20/2022 CLINICAL DATA:  Fall, found on floor EXAM: CT CHEST, ABDOMEN, AND PELVIS WITH CONTRAST TECHNIQUE: Multidetector CT imaging of the chest, abdomen and  pelvis was performed following the standard protocol during bolus administration of intravenous contrast. RADIATION DOSE REDUCTION: This exam was performed according to the departmental dose-optimization program which includes automated exposure control, adjustment of the mA and/or kV according to patient size and/or use of iterative reconstruction technique. CONTRAST:  100mL OMNIPAQUE IOHEXOL 300 MG/ML  SOLN COMPARISON:  CT abdomen pelvis, 03/18/2010 FINDINGS: CT CHEST FINDINGS Cardiovascular: Aortic atherosclerosis. Normal heart size. No pericardial effusion. Mediastinum/Nodes: No enlarged mediastinal, hilar, or axillary lymph nodes. Thyroid gland, trachea, and esophagus demonstrate no significant findings. Lungs/Pleura: Severe emphysema. Mild, diffuse bilateral bronchial wall thickening. Interlobular septal thickening. Small bilateral pleural effusions and associated atelectasis or consolidation. Musculoskeletal: No chest wall abnormality. Mildly displaced, acute fractures of the lateral and posterior left seventh through twelfth ribs. CT ABDOMEN PELVIS FINDINGS Hepatobiliary: No solid liver abnormality is seen. No gallstones, gallbladder wall thickening, or biliary dilatation. Pancreas: Unremarkable. No pancreatic ductal dilatation or surrounding inflammatory changes. Spleen: Normal in size without significant abnormality. Adrenals/Urinary Tract: Adrenal glands are unremarkable. 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 The left kidney is normal, without renal calculi, solid lesion, or hydronephrosis. Bladder is unremarkable. Stomach/Bowel: Stomach is within normal limits. Appendix appears normal. No evidence of bowel wall thickening, distention, or inflammatory changes. Vascular/Lymphatic: Aortic atherosclerosis. No enlarged abdominal or pelvic lymph nodes. Reproductive: No mass or other abnormality. Other: No abdominal wall hernia or abnormality. Small volume perihepatic ascites.  Musculoskeletal: No acute osseous findings. IMPRESSION: 1. Mildly displaced, acute fractures of the lateral and posterior left seventh through twelfth ribs. 2. Small bilateral pleural effusions and associated atelectasis or consolidation. No associated pneumothorax. 3. Diffuse bilateral bronchial wall thickening and interlobular septal thickening, most consistent with pulmonary edema. 4. Severe emphysema. 5. Small volume simple fluid attenuation perihepatic ascites. No direct CT evidence of abdominal or pelvic organ injury. 6. Solitary left kidney. Aortic Atherosclerosis (ICD10-I70.0) and Emphysema (ICD10-J43.9). Electronically Signed   By: Jearld LeschAlex D Bibbey M.D.   On: 03/20/2022 17:32   CT HEAD WO CONTRAST  Result Date: 03/20/2022 CLINICAL DATA:  Head trauma, moderate-severe; Polytrauma, blunt. Fall EXAM: CT HEAD WITHOUT CONTRAST CT CERVICAL SPINE WITHOUT CONTRAST TECHNIQUE: Multidetector CT imaging of the head and cervical spine was performed following the standard protocol without intravenous contrast. Multiplanar CT image reconstructions of the cervical spine were also generated. RADIATION DOSE REDUCTION: This exam was performed according to the departmental dose-optimization program which includes automated exposure control, adjustment of the mA and/or kV according to patient size and/or use of iterative reconstruction technique. COMPARISON:  None Available.  FINDINGS: CT HEAD FINDINGS Brain: No evidence of large-territorial acute infarction. No parenchymal hemorrhage. No mass lesion. No extra-axial collection. No mass effect or midline shift. No hydrocephalus. Basilar cisterns are patent. Vascular: No hyperdense vessel. Atherosclerotic calcifications are present within the cavernous internal carotid arteries. Skull: No acute fracture or focal lesion. Sinuses/Orbits: Paranasal sinuses and mastoid air cells are clear. Bilateral lens replacement. Otherwise the orbits are unremarkable. Other: None. CT CERVICAL  SPINE FINDINGS Alignment: Normal. Skull base and vertebrae: No acute fracture. No aggressive appearing focal osseous lesion or focal pathologic process. Soft tissues and spinal canal: No prevertebral fluid or swelling. No visible canal hematoma. Upper chest: Emphysematous changes. Interlobular septal wall thickening. Other: None. IMPRESSION: 1. No acute intracranial abnormality. 2. No acute displaced fracture or traumatic listhesis of the cervical spine. 3.  Emphysema (ICD10-J43.9). 4. Possible pulmonary edema. Please see separately dictated CT chest 03/20/2022. Electronically Signed   By: Tish Frederickson M.D.   On: 03/20/2022 17:14   CT CERVICAL SPINE WO CONTRAST  Result Date: 03/20/2022 CLINICAL DATA:  Head trauma, moderate-severe; Polytrauma, blunt. Fall EXAM: CT HEAD WITHOUT CONTRAST CT CERVICAL SPINE WITHOUT CONTRAST TECHNIQUE: Multidetector CT imaging of the head and cervical spine was performed following the standard protocol without intravenous contrast. Multiplanar CT image reconstructions of the cervical spine were also generated. RADIATION DOSE REDUCTION: This exam was performed according to the departmental dose-optimization program which includes automated exposure control, adjustment of the mA and/or kV according to patient size and/or use of iterative reconstruction technique. COMPARISON:  None Available. FINDINGS: CT HEAD FINDINGS Brain: No evidence of large-territorial acute infarction. No parenchymal hemorrhage. No mass lesion. No extra-axial collection. No mass effect or midline shift. No hydrocephalus. Basilar cisterns are patent. Vascular: No hyperdense vessel. Atherosclerotic calcifications are present within the cavernous internal carotid arteries. Skull: No acute fracture or focal lesion. Sinuses/Orbits: Paranasal sinuses and mastoid air cells are clear. Bilateral lens replacement. Otherwise the orbits are unremarkable. Other: None. CT CERVICAL SPINE FINDINGS Alignment: Normal. Skull base  and vertebrae: No acute fracture. No aggressive appearing focal osseous lesion or focal pathologic process. Soft tissues and spinal canal: No prevertebral fluid or swelling. No visible canal hematoma. Upper chest: Emphysematous changes. Interlobular septal wall thickening. Other: None. IMPRESSION: 1. No acute intracranial abnormality. 2. No acute displaced fracture or traumatic listhesis of the cervical spine. 3.  Emphysema (ICD10-J43.9). 4. Possible pulmonary edema. Please see separately dictated CT chest 03/20/2022. Electronically Signed   By: Tish Frederickson M.D.   On: 03/20/2022 17:14   DG Pelvis Portable  Result Date: 03/20/2022 CLINICAL DATA:  Trauma EXAM: PORTABLE PELVIS 1-2 VIEWS COMPARISON:  March 09, 2010 FINDINGS: Evaluation is limited by technique. No pelvic diastasis. Degenerative changes of bilateral hips. Limited assessment of the sacrum secondary to overlapping bowel contents. No definitive acute displaced fracture is visualized. IMPRESSION: Limited evaluation due to technique. No definitive acute displaced fracture is visualized. If persistent clinical concern, recommend dedicated cross-sectional imaging or additional radiographic views. Electronically Signed   By: Meda Klinefelter M.D.   On: 03/20/2022 16:02   DG Chest Port 1 View  Result Date: 03/20/2022 CLINICAL DATA:  Trauma EXAM: PORTABLE CHEST 1 VIEW COMPARISON:  March 18, 2010 FINDINGS: The cardiomediastinal silhouette is enlarged in contour, increased since 2011.Atherosclerotic calcifications. No pleural effusion. No pneumothorax. Diffuse coarse interstitial opacities. Questionable nodular opacity at the RIGHT apex. There are several age indeterminate LEFT-sided rib fractures of the approximate fifth and sixth ribs. IMPRESSION: 1. Age indeterminate LEFT-sided rib fractures.  Recommend correlation with point tenderness. No pneumothorax is identified. 2. Questionable RIGHT apical nodular opacity versus summation artifact.  Consider PA and lateral chest radiograph versus dedicated CT scan for improved evaluation. 3. Increased cardiomegaly in comparison to prior from 2011. 4. Diffuse coarse reticulation may reflect a degree of underlying interstitial lung disease or pulmonary emphysema. Electronically Signed   By: Meda Klinefelter M.D.   On: 03/20/2022 16:01   DG Tibia/Fibula Right Port  Result Date: 03/20/2022 CLINICAL DATA:  Blunt Trauma EXAM: PORTABLE RIGHT TIBIA AND FIBULA - 2 VIEW COMPARISON:  None Available. FINDINGS: Osteopenia. There is an oblique fracture of the distal tibial shaft with minimal lateral displacement of the distal fragment. Nondisplaced component extends inferiorly. There is a minimally displaced fracture of the fibular head. No unexpected radiopaque foreign body. Soft tissue edema. IMPRESSION: Minimally displaced oblique fracture of the distal tibial shaft and a minimally displaced fracture of the fibular head. Electronically Signed   By: Meda Klinefelter M.D.   On: 03/20/2022 15:57    Microbiology: Results for orders placed or performed during the hospital encounter of 03/20/22  Resp Panel by RT-PCR (Flu A&B, Covid) Anterior Nasal Swab     Status: None   Collection Time: 03/20/22  3:20 PM   Specimen: Anterior Nasal Swab  Result Value Ref Range Status   SARS Coronavirus 2 by RT PCR NEGATIVE NEGATIVE Final    Comment: (NOTE) SARS-CoV-2 target nucleic acids are NOT DETECTED.  The SARS-CoV-2 RNA is generally detectable in upper respiratory specimens during the acute phase of infection. The lowest concentration of SARS-CoV-2 viral copies this assay can detect is 138 copies/mL. A negative result does not preclude SARS-Cov-2 infection and should not be used as the sole basis for treatment or other patient management decisions. A negative result may occur with  improper specimen collection/handling, submission of specimen other than nasopharyngeal swab, presence of viral mutation(s) within  the areas targeted by this assay, and inadequate number of viral copies(<138 copies/mL). A negative result must be combined with clinical observations, patient history, and epidemiological information. The expected result is Negative.  Fact Sheet for Patients:  BloggerCourse.com  Fact Sheet for Healthcare Providers:  SeriousBroker.it  This test is no t yet approved or cleared by the [place] FDA and  has been authorized for detection and/or diagnosis of SARS-CoV-2 by FDA under an Emergency Use Authorization (EUA). This EUA will remain  in effect (meaning this test can be used) for the duration of the COVID-19 declaration under Section 564(b)(1) of the Act, 21 U.S.C.section 360bbb-3(b)(1), unless the authorization is terminated  or revoked sooner.       Influenza A by PCR NEGATIVE NEGATIVE Final   Influenza B by PCR NEGATIVE NEGATIVE Final    Comment: (NOTE) The Xpert Xpress SARS-CoV-2/FLU/RSV plus assay is intended as an aid in the diagnosis of influenza from Nasopharyngeal swab specimens and should not be used as a sole basis for treatment. Nasal washings and aspirates are unacceptable for Xpert Xpress SARS-CoV-2/FLU/RSV testing.  Fact Sheet for Patients: BloggerCourse.com  Fact Sheet for Healthcare Providers: SeriousBroker.it  This test is not yet approved or cleared by the [place] FDA and has been authorized for detection and/or diagnosis of SARS-CoV-2 by FDA under an Emergency Use Authorization (EUA). This EUA will remain in effect (meaning this test can be used) for the duration of the COVID-19 declaration under Section 564(b)(1) of the Act, 21 U.S.C. section 360bbb-3(b)(1), unless the authorization is terminated or revoked.  Performed at  [facility], [address] [place], [place] [address]   Surgical pcr screen     Status: Abnormal   Collection Time:  03/22/22 10:53 PM   Specimen: Nasal Mucosa; Nasal Swab  Result Value Ref Range Status   MRSA, PCR NEGATIVE NEGATIVE Final   Staphylococcus aureus POSITIVE (A) NEGATIVE Final    Comment: (NOTE) The Xpert SA Assay (FDA approved for NASAL specimens in patients 84 years of age and older), is one component of a comprehensive surveillance program. It is not intended to diagnose infection nor to guide or monitor treatment. Performed at [facility], [address] [place], [place] [address]    Labs: CBC: Recent Labs  Lab 03/21/22 [phone] 03/22/22 0944 03/24/22 0257 03/25/22 0247 03/25/22 1036  WBC 8.5 6.8 7.5 7.1 7.6  NEUTROABS 6.7 5.3  --   --   --   HGB 12.5 12.8 12.5 10.8* 11.4*  HCT 38.7 37.9 36.7 31.8* 34.0*  MCV 107.5* 101.3* 100.8* 101.9* 103.0*  PLT 106* 101* 113* 99* 107*   Basic Metabolic Panel: Recent Labs  Lab 03/21/22 0446 03/22/22 0944 03/23/22 0942 03/24/22 0257 03/25/22 0247  NA 138 136 138 134* 137  K 3.6 3.0* 3.1* 3.8 3.4*  CL 99 91* 93* 91* 97*  CO2 33* 34* 33* 34* 30  GLUCOSE 97 131* 87 147* 162*  BUN CREATININE 0.94 1.04* 0.96 1.30* 1.09*  CALCIUM 8.4* 8.4* 8.4* 8.5* 8.4*  MG 1.9  --  1.7  --  1.9   Liver Function Tests: Recent Labs  Lab 03/21/22 0446 03/22/22 0944 03/23/22 0942 03/24/22 0257 03/25/22 0247  AST 283* 211* 103* 54* 28  ALT 197* 200* 157* 105* 33  ALKPHOS 83 72 68 65 67  BILITOT 1.7* 2.2* 1.8* 0.9 0.8  PROT 5.7* 5.2* 5.2* 5.1* 4.5*  ALBUMIN 3.1* 2.8* 2.9* 2.8* 2.5*   CBG: No results for input(s): "GLUCAP" in the last 168 hours.  Discharge time spent: greater than 30 minutes.  Signed: Lynden Oxford, MD [facility]

## 2022-03-26 DIAGNOSIS — J9601 Acute respiratory failure with hypoxia: Secondary | ICD-10-CM | POA: Diagnosis not present

## 2022-03-26 LAB — COMPREHENSIVE METABOLIC PANEL
ALT: 21 U/L (ref 0–44)
AST: 26 U/L (ref 15–41)
Albumin: 2.6 g/dL — ABNORMAL LOW (ref 3.5–5.0)
Alkaline Phosphatase: 79 U/L (ref 38–126)
Anion gap: 3 — ABNORMAL LOW (ref 5–15)
BUN: 17 mg/dL (ref 8–23)
CO2: 30 mmol/L (ref 22–32)
Calcium: 8.6 mg/dL — ABNORMAL LOW (ref 8.9–10.3)
Chloride: 104 mmol/L (ref 98–111)
Creatinine, Ser: 0.99 mg/dL (ref 0.44–1.00)
GFR, Estimated: >60 mL/min (ref >60)
Glucose, Bld: 101 mg/dL — ABNORMAL HIGH (ref 70–99)
Potassium: 4.9 mmol/L (ref 3.5–5.1)
Sodium: 137 mmol/L (ref 135–145)
Total Bilirubin: 1.2 mg/dL (ref 0.3–1.2)
Total Protein: 4.8 g/dL — ABNORMAL LOW (ref 6.5–8.1)

## 2022-03-26 LAB — CBC
HCT: 32.4 % — ABNORMAL LOW (ref 36.0–46.0)
Hemoglobin: 10.6 g/dL — ABNORMAL LOW (ref 12.0–15.0)
MCH: 34.4 pg — ABNORMAL HIGH (ref 26.0–34.0)
MCHC: 32.7 g/dL (ref 30.0–36.0)
MCV: 105.2 fL — ABNORMAL HIGH (ref 80.0–100.0)
Platelets: 106 10*3/uL — ABNORMAL LOW (ref 150–400)
RBC: 3.08 MIL/uL — ABNORMAL LOW (ref 3.87–5.11)
RDW: 14.9 % (ref 11.5–15.5)
WBC: 6.1 10*3/uL (ref 4.0–10.5)
nRBC: 0 % (ref 0.0–0.2)

## 2022-03-26 LAB — MAGNESIUM: Magnesium: 2.1 mg/dL (ref 1.7–2.4)

## 2022-03-26 MED ORDER — GABAPENTIN 100 MG PO CAPS
100.0000 mg | ORAL_CAPSULE | Freq: Three times a day (TID) | ORAL | Status: DC
  Administered 2022-03-26 – 2022-03-30 (×12): 100 mg via ORAL
  Filled 2022-03-26 (×12): qty 1

## 2022-03-26 NOTE — TOC Progression Note (Signed)
Transition of Care [facility]) - Progression Note    Patient Details  Name: Valerie Bradley MRN: [id number] Date of Birth: January 06, 1956  Transition of Care [facility]) CM/SW Contact  Emeterio Reeve, [place] Phone Number: 03/26/2022, 10:04 AM  Clinical Narrative:     LCSW called Levada Dy at [facility], they are unable to offer bed. [facility] does not have an weekend admissions coordinator to review of there weekend. [facility] has declined.   Expected Discharge Plan: [place] Barriers to Discharge: Continued Medical Work up, Ship broker, SNF Pending bed offer  Expected Discharge Plan and Services Expected Discharge Plan: [place] Choice: [place] arrangements for the past 2 months: Single Family Home                                       Social Determinants of Health (SDOH) Interventions    Readmission Risk Interventions     No data to display         Emeterio Reeve, LCSW Clinical Social Worker

## 2022-03-26 NOTE — Plan of Care (Signed)
  Problem: Clinical Measurements: Goal: Will remain free from infection Outcome: Progressing   Problem: Safety: Goal: Ability to remain free from injury will improve Outcome: Progressing   

## 2022-03-26 NOTE — Progress Notes (Signed)
[facility] PROGRESS NOTE  Patient: Valerie Bradley ZPH:[id number]   PCP: Pcp, No DOB: May 15, 1956   DOA: 03/20/2022   DOS: 03/26/2022    Subjective: No acute complaint.  Continues to have shocklike sensation in the operated leg.  No nausea no vomiting no fever no chills.  Objective:  Vitals:   03/26/22 0000 03/26/22 0200 03/26/22 0400 03/26/22 0853  BP:   (!) 112/49 (!) 115/50  Pulse: 88 95 90 98  Resp: 10 18 17 16   Temp:   98.5 F (36.9 C)   TempSrc:   Oral   SpO2: 93% 91% 93% 94%  Weight:      Height:       Clear to auscultation. S1-S2 present. Bowel sound present.  Assessment and plan: Pain control. Increase gabapentin from twice daily to 3 times daily.  Nutritional. Continue Ensure.  Medically stable.  Awaiting transfer to SNF.  Author: Berle Mull, MD [facility] 03/26/2022 7:08 PM   If 7PM-7AM, please contact night-coverage at [email or web address]

## 2022-03-27 NOTE — Progress Notes (Signed)
[facility] PROGRESS NOTE  Patient: Valerie Bradley VOZ:[id number]   PCP: Pcp, No DOB: Oct 09, 1955   DOA: 03/20/2022   DOS: 03/27/2022    Subjective: Pain well controlled.  Ambulated with the mobility tech 196 feet.  No nausea no vomiting.  Objective:   Vitals:   03/26/22 2034 03/27/22 0524 03/27/22 0746 03/27/22 1225  BP: (!) 88/36 (!) 102/46  (!) 103/52  Pulse: 93 93 92 (!) 101  Resp: 15 17 16 18   Temp: 98.3 F (36.8 C) 98.5 F (36.9 C)  98.6 F (37 C)  TempSrc:    Oral  SpO2: (!) 87% 95%  93%  Weight:      Height:        S1-S2 present. Clear to auscultation.  Assessment and plan: Active Issues Continue pain control. Currently awaiting placement. Medically stable.  Brief Hospital course 66 year old female with a history of anxiety on Valium, was brought to the hospital after a fall.  She was noted to be lethargic when she was found on the floor.  Work-up in the emergency room showed that she did have several rib fractures in the left chest, also noted to have a right tibia/fibula fracture.  She was noted to be hypoxic and hypercapnic and was initially placed on BiPAP.  Further imaging did indicate some volume overload.  EDP discussed case with trauma service as well as orthopedics at [facility].  Patient was transferred to [place] service recommended no significant change in the therapy from their point of view. Orthopedic service to pay patient for intramedullary nailing of the right tibia fracture on 10/4. Postoperatively remaining stable.  Currently awaiting transfer to SNF.  Medically stable.   Principal Problem:   Acute respiratory failure with hypoxia (HCC) Active Problems:   Acute metabolic encephalopathy   Traumatic closed displaced fracture of rib on left side   Closed tibia fracture   Fibula fracture   Anxiety   Fall at home, initial encounter   Tobacco use disorder   Elevated troponin    Author: Berle Mull, MD [facility] 03/27/2022 5:27 PM   If 7PM-7AM, please contact night-coverage at [email or web address]

## 2022-03-27 NOTE — Progress Notes (Signed)
Mobility Specialist Progress Note   03/27/22 1159  Mobility  Activity Ambulated with assistance in hallway  Activity Response Tolerated well  Distance Ambulated (ft) 196 ft  $Mobility charge 1 Mobility  Level of Assistance Contact guard assist, steadying assist  Assistive Device Front wheel walker  RLE Weight Bearing WBAT   Received pt in bed having general R knee discomfort but agreeable. No faults during ambulation but min cues for corrective posture. Returned back to bed and sat in chair position w/ food tray in front, bed alarm on.    Holland Falling Mobility Specialist [place] #:  [phone] Acute Rehab Office:  [phone]

## 2022-03-28 NOTE — Progress Notes (Signed)
[facility] PROGRESS NOTE  Patient: Valerie Bradley PNT:[id number]   PCP: Pcp, No DOB: 01-Jun-1956   DOA: 03/20/2022   DOS: 03/28/2022    Subjective: No acute complaint.  No nausea no vomiting.  Ambulated.  Still has pain.  Objective:   Vitals:   03/28/22 0359 03/28/22 0832 03/28/22 0835 03/28/22 0945  BP: (!) 107/36   (!) 102/48  Pulse: 90   (!) 109  Resp: 17   16  Temp: 98.3 F (36.8 C)   98.1 F (36.7 C)  TempSrc:    Oral  SpO2: 95% 94% 94% 97%  Weight:      Height:        Clear to auscultation. S1-S2 present.  Assessment and plan: Active Issues Medically stable. Awaiting placement.  Brief Hospital course 66 year old female with a history of anxiety on Valium, was brought to the hospital after a fall.  She was noted to be lethargic when she was found on the floor.  Work-up in the emergency room showed that she did have several rib fractures in the left chest, also noted to have a right tibia/fibula fracture.  She was noted to be hypoxic and hypercapnic and was initially placed on BiPAP.  Further imaging did indicate some volume overload.  EDP discussed case with trauma service as well as orthopedics at [facility].  Patient was transferred to [place] service recommended no significant change in the therapy from their point of view. Orthopedic service to pay patient for intramedullary nailing of the right tibia fracture on 10/4. Postoperatively remaining stable.  Currently awaiting transfer to SNF.  Medically stable.   Principal Problem:   Acute respiratory failure with hypoxia (HCC) Active Problems:   Acute metabolic encephalopathy   Traumatic closed displaced fracture of rib on left side   Closed tibia fracture   Fibula fracture   Anxiety   Fall at home, initial encounter   Tobacco use disorder   Elevated troponin    Author: Berle Mull, MD [facility] 03/28/2022 7:30 PM   If 7PM-7AM, please contact night-coverage at [email or web address]

## 2022-03-28 NOTE — Progress Notes (Signed)
Physical Therapy Treatment Patient Details Name: Valerie Bradley MRN: [id number] DOB: April 24, 1956 Today's Date: 03/28/2022   History of Present Illness 66 y.o. female presents to [facility] on 03/20/2022 after a fall, with likely overdose. Pt found to have R distal tibia fx and minimally displaced fibula fx. Chest x-ray with fractures of 5 ribs on left side. Pt underwent IM nailing of RLE on 03/23/2022. PMH includes anxiety, OA, opiate abuse.    PT Comments    Patient progressing well towards PT goals. Session focused on progressive ambulation and safety with mobility. Tolerated Min guard-Min A for transfers and gait training with use of RW for support. Continues to have some mild balance deficits and impulsive at times during ambulation putting pt at increased risk for falls. Able to doff CAM boot today with cues. Tolerated there ex of RLE. Reviewed importance of wearing CAM boot for OOB and walking. Continues to be appropriate for SNF. Will follow.    Recommendations for follow up therapy are one component of a multi-disciplinary discharge planning process, led by the attending physician.  Recommendations may be updated based on patient status, additional functional criteria and insurance authorization.  Follow Up Recommendations  Skilled nursing-short term rehab (<3 hours/day) Can patient physically be transported by private vehicle: Yes   Assistance Recommended at Discharge Intermittent Supervision/Assistance  Patient can return home with the following Assistance with cooking/housework;Direct supervision/assist for medications management;Direct supervision/assist for financial management;Assist for transportation;Help with stairs or ramp for entrance;A little help with walking and/or transfers;A little help with bathing/dressing/bathroom   Equipment Recommendations  Rolling walker (2 wheels);BSC/3in1    Recommendations for Other Services       Precautions / Restrictions  Precautions Precautions: Fall Required Braces or Orthoses: Other Brace Other Brace: CAM boot RLE Restrictions Weight Bearing Restrictions: Yes RLE Weight Bearing: Weight bearing as tolerated Other Position/Activity Restrictions: WBAT inCAM     Mobility  Bed Mobility Overal bed mobility: Needs Assistance Bed Mobility: Supine to Sit, Sit to Supine     Supine to sit: Supervision, HOB elevated Sit to supine: Supervision, HOB elevated   General bed mobility comments: Supervision for safety. Increased time.    Transfers Overall transfer level: Needs assistance Equipment used: Rolling walker (2 wheels) Transfers: Sit to/from Stand Sit to Stand: Min guard           General transfer comment: Min guard for safety. Stood from [employer], from toilet x1, transferred to chair post ambulation.    Ambulation/Gait Ambulation/Gait assistance: Min assist Gait Distance (Feet): 150 Feet Assistive device: Rolling walker (2 wheels) Gait Pattern/deviations: Step-through pattern, Decreased stance time - right, Decreased step length - left Gait velocity: fast Gait velocity interpretation: 1.31 - 2.62 ft/sec, indicative of limited community ambulator   General Gait Details: Fast, step through gait with cues for RW management and cues to slow down, a few LOB posteriorly. Does not have shoe for LLE today. A few standing rest breaks needed.   Stairs             Wheelchair Mobility    Modified Rankin (Stroke Patients Only)       Balance Overall balance assessment: Needs assistance Sitting-balance support: Feet supported, No upper extremity supported Sitting balance-Leahy Scale: Good Sitting balance - Comments: Able to reach down and doff CAM boot.   Standing balance support: During functional activity, Reliant on assistive device for balance Standing balance-Leahy Scale: Poor Standing balance comment: Use of RW. Can take 1 UE off RW but needs  close Min guard-Min A                             Cognition Arousal/Alertness: Awake/alert Behavior During Therapy: WFL for tasks assessed/performed Overall Cognitive Status: Impaired/Different from baseline Area of Impairment: Safety/judgement                         Safety/Judgement: Decreased awareness of safety     General Comments: "I have been getting up and walking without my boot" CUes needed for safety.        Exercises General Exercises - Lower Extremity Ankle Circles/Pumps: AROM, Both, 10 reps, Supine Quad Sets: AROM, Both, 10 reps, Supine Long Arc Quad: Strengthening, Right, 10 reps, Seated    General Comments General comments (skin integrity, edema, etc.): VSS on RA.      Pertinent Vitals/Pain Pain Assessment Pain Assessment: Faces Faces Pain Scale: Hurts little more Pain Location: Rt anterior knee Pain Descriptors / Indicators: Sore Pain Intervention(s): Monitored during session, Repositioned, Limited activity within patient's tolerance    Home Living                          Prior Function            PT Goals (current goals can now be found in the care plan section) Progress towards PT goals: Progressing toward goals    Frequency    Min 3X/week      PT Plan Current plan remains appropriate    Co-evaluation              AM-PAC PT "6 Clicks" Mobility   Outcome Measure  Help needed turning from your back to your side while in a flat bed without using bedrails?: A Little Help needed moving from lying on your back to sitting on the side of a flat bed without using bedrails?: A Little Help needed moving to and from a bed to a chair (including a wheelchair)?: A Little Help needed standing up from a chair using your arms (e.g., wheelchair or bedside chair)?: A Little Help needed to walk in hospital room?: A Little Help needed climbing 3-5 steps with a railing? : Total 6 Click Score: 16    End of Session Equipment Utilized During Treatment: Gait  belt Activity Tolerance: Patient tolerated treatment well Patient left: in chair;with call bell/phone within reach;with chair alarm set Nurse Communication: Mobility status PT Visit Diagnosis: Other abnormalities of gait and mobility (R26.89);Pain Pain - Right/Left: Right Pain - part of body: Leg     Time: 4098-1191 PT Time Calculation (min) (ACUTE ONLY): 21 min  Charges:  $Gait Training: 8-22 mins                     Marisa Severin, PT, DPT Acute Rehabilitation Services Secure chat preferred Office [place] 03/28/2022, 12:39 PM

## 2022-03-28 NOTE — TOC Progression Note (Addendum)
Transition of Care [facility]) - Progression Note    Patient Details  Name: DANIKA KLUENDER MRN: [id number] Date of Birth: 1956/02/22  Transition [facility] Care [facility] CM/SW Contact  Joanne Chars, LCSW Phone Number: 03/28/2022, 9:48 AM  Clinical Narrative:   [facility], [facility] --they cannot offer a bed, cannot accept pt with substance use issues and also cannot accept pt on suboxone.   1000: TC son Darnelle Maffucci, discussed only bed offer is [facility].  He is going to reach out to [facility] himself.  Still hoping pt can be placed closer to where he lives but understands pt only has one offer.  He will call back shortly.   1030: TC [place].  He spoke with Butch Penny at [facility] and the agreed to reconsider.  She is requesting MAR and facesheet. These were sent.  He is also asking that referral be sent to [facility], [phone], fax: [phone].  This was done.   Expected Discharge Plan: [place] Barriers to Discharge: Continued Medical Work up, Ship broker, SNF Pending bed offer  Expected Discharge Plan and Services Expected Discharge Plan: [place] Choice: [place] arrangements for the past 2 months: Single Family Home                                       Social Determinants of Health (SDOH) Interventions    Readmission Risk Interventions     No data to display

## 2022-03-28 NOTE — Progress Notes (Signed)
Mobility Specialist Progress Note   03/28/22 1142  Mobility  Activity Ambulated with assistance in hallway  Level of Assistance Contact guard assist, steadying assist  Assistive Device Front wheel walker  Distance Ambulated (ft) 120 ft  RLE Weight Bearing WBAT  Activity Response Tolerated well   Pt received in bed c/o pain in RLE (8/10) but agreeable. Antalgic like gait today d/t pain but no physical assistance throughout session. Returned to bed w/o fault and all needs met.  Holland Falling Mobility Specialist MS [facility] #:  [phone] Acute Rehab Office:  [phone]

## 2022-03-28 NOTE — Plan of Care (Signed)
  Problem: Education: Goal: Knowledge of disease or condition will improve Outcome: Progressing   Problem: Activity: Goal: Ability to tolerate increased activity will improve Outcome: Progressing   Problem: Respiratory: Goal: Ability to maintain a clear airway will improve Outcome: Progressing   Problem: Education: Goal: Knowledge of General Education information will improve Description: Including pain rating scale, medication(s)/side effects and non-pharmacologic comfort measures Outcome: Progressing   Problem: Clinical Measurements: Goal: Ability to maintain clinical measurements within normal limits will improve Outcome: Progressing Goal: Diagnostic test results will improve Outcome: Progressing   Problem: Activity: Goal: Risk for activity intolerance will decrease Outcome: Progressing   Problem: Nutrition: Goal: Adequate nutrition will be maintained Outcome: Progressing   Problem: Pain Managment: Goal: General experience of comfort will improve Outcome: Progressing   Problem: Safety: Goal: Ability to remain free from injury will improve Outcome: Progressing   Problem: Skin Integrity: Goal: Risk for impaired skin integrity will decrease Outcome: Progressing   Problem: Education: Goal: Knowledge of General Education information will improve Description: Including pain rating scale, medication(s)/side effects and non-pharmacologic comfort measures Outcome: Progressing   Problem: Activity: Goal: Risk for activity intolerance will decrease Outcome: Progressing

## 2022-03-29 LAB — SARS CORONAVIRUS 2 BY RT PCR: SARS Coronavirus 2 by RT PCR: NEGATIVE

## 2022-03-29 MED ORDER — SODIUM CHLORIDE 0.9 % IV BOLUS
250.0000 mL | Freq: Once | INTRAVENOUS | Status: AC
  Administered 2022-03-29: 250 mL via INTRAVENOUS

## 2022-03-29 MED ORDER — GABAPENTIN 100 MG PO CAPS: 100.0000 mg | ORAL_CAPSULE | Freq: Three times a day (TID) | ORAL | 0 refills | Status: AC

## 2022-03-29 NOTE — Progress Notes (Signed)
Physical Therapy Treatment Patient Details Name: Valerie Bradley MRN: [id number] DOB: 28-Oct-1955 Today's Date: 03/29/2022   History of Present Illness 66 y.o. female presents to [facility] hospital on 03/20/2022 after a fall, with likely overdose. Pt found to have R distal tibia fx and minimally displaced fibula fx. Chest x-ray with fractures of 5 ribs on left side. Pt underwent IM nailing of RLE on 03/23/2022. PMH includes anxiety, OA, opiate abuse.    PT Comments    Pt demonstrating good progress.  She does still need min A at times and frequent cues for safety.  Pt with limited caregiver support so continue to recommend SNF (noted bed available with plan to d/c tomorrow).    Recommendations for follow up therapy are one component of a multi-disciplinary discharge planning process, led by the attending physician.  Recommendations may be updated based on patient status, additional functional criteria and insurance authorization.  Follow Up Recommendations  Skilled nursing-short term rehab (<3 hours/day) Can patient physically be transported by private vehicle: Yes   Assistance Recommended at Discharge Intermittent Supervision/Assistance  Patient can return home with the following Assistance with cooking/housework;Direct supervision/assist for medications management;Direct supervision/assist for financial management;Assist for transportation;Help with stairs or ramp for entrance;A little help with walking and/or transfers;A little help with bathing/dressing/bathroom   Equipment Recommendations  Rolling walker (2 wheels);BSC/3in1    Recommendations for Other Services       Precautions / Restrictions Precautions Precautions: Fall Required Braces or Orthoses: Other Brace Other Brace: CAM boot RLE Restrictions Weight Bearing Restrictions: Yes RLE Weight Bearing: Weight bearing as tolerated Other Position/Activity Restrictions: WBAT inCAM     Mobility  Bed Mobility Overal bed mobility: Needs  Assistance Bed Mobility: Supine to Sit     Supine to sit: Supervision, HOB elevated     General bed mobility comments: Supervision for safety. Increased time.    Transfers Overall transfer level: Needs assistance Equipment used: Rolling walker (2 wheels) Transfers: Sit to/from Stand Sit to Stand: Min assist           General transfer comment: Min guard with standing but then min A for controlled descent (pt reached back to R armrest and tried for L armrest but missed L and needed min A to control)    Ambulation/Gait Ambulation/Gait assistance: Min assist Gait Distance (Feet): 150 Feet   Gait Pattern/deviations: Decreased stance time - right, Decreased step length - left, Step-to pattern       General Gait Details: Antalgic pattern but tolerated.  Min cues for RW proximity and Physicist, medical    Modified Rankin (Stroke Patients Only)       Balance Overall balance assessment: Needs assistance Sitting-balance support: Feet supported, No upper extremity supported Sitting balance-Leahy Scale: Good     Standing balance support: During functional activity, Reliant on assistive device for balance Standing balance-Leahy Scale: Fair Standing balance comment: Static stand no AD but RW to [employer] Arousal/Alertness: Awake/alert Behavior During Therapy: WFL for tasks assessed/performed Overall Cognitive Status: Impaired/Different from baseline Area of Impairment: Safety/judgement                         Safety/Judgement: Decreased awareness of safety     General Comments: Cues  for safety and problem solving how to manage boot        Exercises General Exercises - Lower Extremity Ankle Circles/Pumps: AROM, Both, 10 reps, Supine Quad Sets: AROM, Both, 10 reps, Supine Long Arc Quad: Strengthening, Seated, AROM, Both, 20 reps (10x2, pt doing well with trying to  get R leg completely straight) Hip Flexion/Marching: AROM, Both, 20 reps, Seated    General Comments General comments (skin integrity, edema, etc.): VSS      Pertinent Vitals/Pain Pain Assessment Pain Assessment: 0-10 Pain Score: 5  Pain Location: Rt anterior knee Pain Descriptors / Indicators: Sore    Home Living                          Prior Function            PT Goals (current goals can now be found in the care plan section) Progress towards PT goals: Progressing toward goals    Frequency    Min 3X/week      PT Plan Current plan remains appropriate    Co-evaluation              AM-PAC PT "6 Clicks" Mobility   Outcome Measure  Help needed turning from your back to your side while in a flat bed without using bedrails?: A Little Help needed moving from lying on your back to sitting on the side of a flat bed without using bedrails?: A Little Help needed moving to and from a bed to a chair (including a wheelchair)?: A Little Help needed standing up from a chair using your arms (e.g., wheelchair or bedside chair)?: A Little Help needed to walk in hospital room?: A Little Help needed climbing 3-5 steps with a railing? : A Little 6 Click Score: 18    End of Session Equipment Utilized During Treatment: Gait belt Activity Tolerance: Patient tolerated treatment well Patient left: in chair;with call bell/phone within reach;with chair alarm set Nurse Communication: Mobility status PT Visit Diagnosis: Other abnormalities of gait and mobility (R26.89);Pain Pain - Right/Left: Right Pain - part of body: Leg     Time: 1710-1730 PT Time Calculation (min) (ACUTE ONLY): 20 min  Charges:  $Gait Training: 8-22 mins                     Anise Salvo, PT Acute Rehab [facility] [phone]    Rayetta Humphrey 03/29/2022, 5:36 PM

## 2022-03-29 NOTE — Plan of Care (Signed)
  Problem: Education: Goal: Knowledge of disease or condition will improve Outcome: Progressing Goal: Knowledge of the prescribed therapeutic regimen will improve Outcome: Progressing Goal: Individualized Educational Video(s) Outcome: Progressing   Problem: Activity: Goal: Ability to tolerate increased activity will improve Outcome: Progressing Goal: Will verbalize the importance of balancing activity with adequate rest periods Outcome: Progressing   Problem: Respiratory: Goal: Ability to maintain a clear airway will improve Outcome: Progressing Goal: Levels of oxygenation will improve Outcome: Progressing Goal: Ability to maintain adequate ventilation will improve Outcome: Progressing   Problem: Education: Goal: Knowledge of General Education information will improve Description: Including pain rating scale, medication(s)/side effects and non-pharmacologic comfort measures Outcome: Progressing   Problem: Health Behavior/Discharge Planning: Goal: Ability to manage health-related needs will improve Outcome: Progressing   Problem: Clinical Measurements: Goal: Ability to maintain clinical measurements within normal limits will improve Outcome: Progressing Goal: Will remain free from infection Outcome: Progressing Goal: Diagnostic test results will improve Outcome: Progressing Goal: Respiratory complications will improve Outcome: Progressing Goal: Cardiovascular complication will be avoided Outcome: Progressing   Problem: Activity: Goal: Risk for activity intolerance will decrease Outcome: Progressing   Problem: Nutrition: Goal: Adequate nutrition will be maintained Outcome: Progressing   Problem: Coping: Goal: Level of anxiety will decrease Outcome: Progressing   Problem: Elimination: Goal: Will not experience complications related to bowel motility Outcome: Progressing Goal: Will not experience complications related to urinary retention Outcome: Progressing    Problem: Pain Managment: Goal: General experience of comfort will improve Outcome: Progressing   Problem: Safety: Goal: Ability to remain free from injury will improve Outcome: Progressing   Problem: Skin Integrity: Goal: Risk for impaired skin integrity will decrease Outcome: Progressing   Problem: Education: Goal: Knowledge of General Education information will improve Description: Including pain rating scale, medication(s)/side effects and non-pharmacologic comfort measures Outcome: Progressing   Problem: Health Behavior/Discharge Planning: Goal: Ability to manage health-related needs will improve Outcome: Progressing   Problem: Clinical Measurements: Goal: Ability to maintain clinical measurements within normal limits will improve Outcome: Progressing Goal: Will remain free from infection Outcome: Progressing Goal: Diagnostic test results will improve Outcome: Progressing Goal: Respiratory complications will improve Outcome: Progressing Goal: Cardiovascular complication will be avoided Outcome: Progressing   Problem: Activity: Goal: Risk for activity intolerance will decrease Outcome: Progressing   Problem: Nutrition: Goal: Adequate nutrition will be maintained Outcome: Progressing   Problem: Coping: Goal: Level of anxiety will decrease Outcome: Progressing   Problem: Elimination: Goal: Will not experience complications related to bowel motility Outcome: Progressing Goal: Will not experience complications related to urinary retention Outcome: Progressing   Problem: Pain Managment: Goal: General experience of comfort will improve Outcome: Progressing   Problem: Safety: Goal: Ability to remain free from injury will improve Outcome: Progressing   Problem: Skin Integrity: Goal: Risk for impaired skin integrity will decrease Outcome: Progressing   

## 2022-03-29 NOTE — Discharge Summary (Addendum)
Physician Discharge Summary   Patient: Valerie [facility] MRN: [id number] DOB: [birth year]  Admit date:     03/20/2022  Discharge date: 03/30/22  Discharge Physician: Marzetta Board  PCP: Pcp, No  Recommendations at discharge:  Follow up as recommended with Orthopedics, PCP and Pain management   Follow-up Information     PCP. Schedule an appointment as soon as possible for a visit in 1 week(s).          Haddix, Thomasene Lot, MD. Schedule an appointment as soon as possible for a visit in 2 week(s).   Specialty: Orthopedic Surgery Why: For wound re-check Contact information: [place] [address] [phone]         Pain management provider. Schedule an appointment as soon as possible for a visit in 1 week(s).   Why: discuss management of medication, pt is off of valuim and pt is only taking  suboxone once a day.               Discharge Diagnoses: Principal Problem:   Acute respiratory failure with hypoxia (HCC) Active Problems:   Acute metabolic encephalopathy   Traumatic closed displaced fracture of rib on left side   Closed tibia fracture   Fibula fracture   Anxiety   Fall at home, initial encounter   Tobacco use disorder   Elevated troponin  Hospital Course: 66 year old female with a history of anxiety on Valium, was brought to the hospital after a fall.  She was noted to be lethargic when she was found on the floor.  Work-up in the emergency room showed that she did have several rib fractures in the left chest, also noted to have a right tibia/fibula fracture.  She was noted to be hypoxic and hypercapnic and was initially placed on BiPAP.  Further imaging did indicate some volume overload.  EDP discussed case with trauma service as well as orthopedics at [facility].  Patient was transferred to [facility] service recommended no significant change in the therapy from their point of view. Orthopedic service to pay patient for  intramedullary nailing of the right tibia fracture on 10/4. Postoperatively remaining stable.  Currently awaiting transfer to SNF.  Medically stable. COVID test negative on 10/10.  Assessment and Plan  Acute respiratory failure with hypoxia and hypercapnia undiagnosed underlying COPD with atelectasis Smoker Possibly related to hypoventilation secondary to medications On admission, she was hypoxic and hypercapnic. She was placed on BiPAP. Currently she is on room air at rest and on exertion She has been smoking 1-1/2 pack/day for several years.   Likely has COPD which is not diagnosed yet. Add inhalers. Pt willing to quit smoking counseled and will continue nicotine patch   Closed right tibia and fibula fracture ED discussed with orthopedics, Dr. Fredonia Highland Underwent intramedullary implant 10/4 with Dr haddix. Weightbearing: WBAT RLE ROM: Okay for knee and ankle range of motion as tolerated Incisional and dressing care: Ok for incisions to be left open to air. Showering: Okay to begin showering getting incisions wet 03/26/2022 Orthopedic device(s): CAM boot RLE when OOB Aspirin 325 mg daily for DVT prophylaxis PT OT recommends SNF.   Left-sided rib fractures, 7th-12th ribs Secondary to fall ED discussed with trauma service, continue incentive spirometry  Recommended conservative measures. Monitor.  Vit D Deficiency  Continue 50,000 units Vit D supplementation q 7 days x 8 weeks   Elevated troponin:  Likely demand ischemia and trauma related.   Echo negative for wall motion  abnormality. No chest pain. On aspirin.    AKI: Resolved with fluids    Elevated LFT resolved  Right upper quadrant ultrasound does not show any acute obstructive process Hepatitis panel negative.   Fall at home Patient was found down Circumstances of fall are not entirely clear   Anxiety Pt on 10 mg valium TID, she thinks she may have taken more than prescribed meds.  For now tolerating being  off of valium, will continue to hold it and add gabapentin.    Chronic pain. Patient is PRESCRIBED Suboxone 3 times daily. But she has weaned herself off of it to only 1 SUBOXONE / DAY.  Continue the same.   Acute metabolic/toxic encephalopathy Patient was found down and was lethargic Possibly related to benzodiazepine overuse She was also noted to have elevated PCO2, but this may be more of a chronic finding Ammonia level normal CT head without acute abnormalities Overall mental status does appear to be improving, currently she is fully alert and oriented. Tolerating reduction of Suboxone.   Underweight. Failure to thrive in adult. Body mass index is 15.55 kg/m. Nutrition Problem: Increased nutrient needs Etiology: post-op healing Nutrition Interventions: Interventions: Ensure Enlive (each supplement provides 350kcal and 20 grams of protein), MVI  Concern for acute pulmonary edema: Ruled out. here is no evidence of pulmonary edema, chest x-ray negative for that, no BNP was obtained.  She does not have crackles.  Pulmonary edema ruled out.  Echo is also not showing any diastolic or systolic congestive heart failure.  She received [facility] at [facility].  Pain control - [facility] Controlled Substance Reporting System database was reviewed. and patient was instructed, not to drive, operate heavy machinery, perform activities at heights, swimming or participation in water activities or provide baby-sitting services while on Pain, Sleep and Anxiety Medications; until their outpatient Physician has advised to do so again. Also recommended to not to take more than prescribed Pain, Sleep and Anxiety Medications.   Consultants:  Orthopedics   Procedures performed:  Intramedullary nailing of right tibia fracture  DISCHARGE MEDICATION: Allergies as of 03/30/2022   Not on File      Medication List     STOP taking these medications    diazepam 10 MG tablet Commonly known as:  VALIUM       TAKE these medications    aspirin EC 325 MG tablet Take 1 tablet (325 mg total) by mouth daily.   buprenorphine-naloxone 8-2 mg Subl SL tablet Commonly known as: SUBOXONE Place 1 tablet under the tongue daily for 5 days. What changed: when to take this   cyanocobalamin 1000 MCG tablet Commonly known as: VITAMIN B12 Take 1 tablet (1,000 mcg total) by mouth daily.   docusate sodium 100 MG capsule Commonly known as: COLACE Take 1 capsule (100 mg total) by mouth 2 (two) times daily.   feeding supplement Liqd Take 237 mLs by mouth 3 (three) times daily between meals.   ferrous gluconate 324 MG tablet Commonly known as: FERGON Take 1 tablet (324 mg total) by mouth daily with breakfast.   folic acid 1 MG tablet Commonly known as: FOLVITE Take 1 tablet (1 mg total) by mouth daily.   gabapentin 100 MG capsule Commonly known as: NEURONTIN Take 1 capsule (100 mg total) by mouth 3 (three) times daily.   lidocaine 5 % Commonly known as: LIDODERM Place 1 patch onto the skin daily for 13 days. Remove & Discard patch within 12 hours or as directed by MD  methocarbamol 500 MG tablet Commonly known as: ROBAXIN Take 1 tablet (500 mg total) by mouth every 6 (six) hours as needed for muscle spasms.   mometasone-formoterol 200-5 MCG/ACT Aero Commonly known as: DULERA Inhale 2 puffs into the lungs 2 (two) times daily.   multivitamin with minerals Tabs tablet Take 1 tablet by mouth daily.   nicotine 21 mg/24hr patch Commonly known as: NICODERM CQ - dosed in mg/24 hours Place 1 patch (21 mg total) onto the skin daily.   oxyCODONE 5 MG immediate release tablet Commonly known as: Oxy IR/ROXICODONE Take 1 tablet (5 mg total) by mouth every 6 (six) hours as needed for severe pain.   polyethylene glycol 17 g packet Commonly known as: MIRALAX / GLYCOLAX Take 17 g by mouth daily as needed for mild constipation.   umeclidinium bromide 62.5 MCG/ACT Aepb Commonly known  as: INCRUSE ELLIPTA Inhale 1 puff into the lungs daily.   Vitamin D (Ergocalciferol) 1.25 MG (50000 UNIT) Caps capsule Commonly known as: DRISDOL Take 1 capsule (50,000 Units total) by mouth every 7 (seven) days.               Discharge Care Instructions  (From admission, onward)           Start     Ordered   03/25/22 0000  Leave dressing on - Keep it clean, dry, and intact until clinic visit        03/25/22 1341           Disposition: SNF Diet recommendation: Regular diet  Discharge Exam: Vitals:   03/29/22 2135 03/30/22 0451 03/30/22 0527 03/30/22 0814  BP: (!) 109/53 (!) 112/56 (!) 110/53   Pulse: 90 95 87   Resp:  19 17   Temp:  98.1 F (36.7 C) 98 F (36.7 C)   TempSrc:  Oral    SpO2: 97% 98% 97% 97%  Weight:      Height:       General: Appear in no distress; no visible Abnormal Neck Mass Or lumps, Conjunctiva normal Cardiovascular: S1 and S2 Present, no Murmur, Respiratory: good respiratory effort, Bilateral Air entry present and CTA, no Crackles, no wheezes Abdomen: Bowel Sound present, Non tender  Extremities: trace Pedal edema Neurology: alert and oriented to time, place, and person  Filed Weights   03/22/22 0552 03/23/22 0313 03/23/22 0957  Weight: 38.7 kg 37.3 kg 38.6 kg   Condition at discharge: stable  The results of significant diagnostics from this hospitalization (including imaging, microbiology, ancillary and laboratory) are listed below for reference.   Imaging Studies: DG Tibia/Fibula Right Port  Result Date: 03/23/2022 CLINICAL DATA:  Fracture.  Interval placement of intramedullary rod. EXAM: PORTABLE RIGHT TIBIA AND FIBULA - 2 VIEW COMPARISON:  Tibia and fibular radiographs 03/20/2022 FINDINGS: Oblique fracture of the distal tibial metaphysis is reduced. Intramedullary rod is in place with 2 proximal and 2 distal interlocking screws. Fluid gas are present in the knee joint. Minimally displaced proximal fibular fracture is stable.  No new fractures are present. IMPRESSION: 1. Interval ORIF of distal tibial fracture without radiographic evidence for complication. 2. Stable minimally displaced proximal fibular fracture. Electronically Signed   By: Marin Roberts M.D.   On: 03/23/2022 13:50   DG Tibia/Fibula Right  Result Date: 03/23/2022 CLINICAL DATA:  Tibial nail.  Intraoperative fluoroscopy. EXAM: RIGHT TIBIA AND FIBULA - 2 VIEW COMPARISON:  Right tibia and fibula radiographs 03/20/2022 FINDINGS: Images were performed intraoperatively without the presence of a radiologist. The patient  is undergoing intramedullary nail fixation of the previously seen spiral fracture of the distal tibial diaphysis. There is improved, now anatomic alignment. Total fluoroscopy images: 6 Total fluoroscopy time: 63 seconds Total dose: Radiation Exposure Index (as provided by the fluoroscopic device): 1.21 mGy air Kerma Please see intraoperative findings for further detail. IMPRESSION: Intraoperative fluoroscopy for intramedullary nail fixation of the distal tibial fracture. Electronically Signed   By: Yvonne Kendall M.D.   On: 03/23/2022 12:37   DG C-Arm 1-60 Min-No Report  Result Date: 03/23/2022 Fluoroscopy was utilized by the requesting physician.  No radiographic interpretation.   DG CHEST PORT 1 VIEW  Result Date: 03/22/2022 CLINICAL DATA:  Multiple rib fractures EXAM: PORTABLE CHEST 1 VIEW COMPARISON:  Portable exam 0813 hours compared to 03/20/2022 FINDINGS: Upper normal heart size. Mediastinal contours and pulmonary vascularity normal. Atherosclerotic calcification aorta. Emphysematous changes with BILATERAL lower lobe atelectasis and small RIGHT pleural effusion. No definite infiltrate or pneumothorax. Diffuse osseous demineralization. Known LEFT rib fractures by recent CT are inadequately demonstrated radiographically. IMPRESSION: COPD changes with bibasilar atelectasis and small RIGHT pleural effusion. Aortic Atherosclerosis  (ICD10-I70.0) and Emphysema (ICD10-J43.9). Electronically Signed   By: Lavonia Dana M.D.   On: 03/22/2022 08:42   ECHOCARDIOGRAM COMPLETE  Result Date: 03/21/2022    ECHOCARDIOGRAM REPORT   Patient Name:   DOREE LONGMAN Date of Exam: 03/21/2022 Medical Rec #:  [id number]      Height:       60.0 in Accession #:    [id number]     Weight:       115.0 lb Date of Birth:  10/29/1955      BSA:          1.475 m Patient Age:    52 years       BP:           140/80 mmHg Patient Gender: F              HR:           96 bpm. Exam Location:  [facility] Procedure: 2D Echo, Cardiac Doppler and Color Doppler Indications:    R06.02 SOB. Acute respiratory distress.  History:        Patient has no prior history of Echocardiogram examinations.                 Signs/Symptoms:Shortness of Breath and Dyspnea; Risk                 Factors:Current Smoker. Elevated troponin.  Sonographer:    Roseanna Rainbow RDCS Referring Phys: [id number] ASIA B McArthur  Sonographer Comments: Technically difficult study due to poor echo windows, suboptimal parasternal window, suboptimal apical window and suboptimal subcostal window. Patient moving constantly during exam. Unable to obtain on- axis images. Patient could not move due to broken leg. Attempted to move from right decubitus position to sight left decubitus. Patient moaning during test. IMPRESSIONS  1. Limited windows and study. Left ventricular ejection fraction, by estimation, is 60 to 65%. The left ventricle has normal function. The left ventricle has no regional wall motion abnormalities. Left ventricular diastolic parameters are indeterminate.  2. Right ventricular systolic function is normal. The right ventricular size is not well visualized. Tricuspid regurgitation signal is inadequate for assessing PA pressure.  3. Moderate pleural effusion.  4. Mild mitral valve regurgitation.  5. The aortic valve was not well visualized. Aortic valve regurgitation is not visualized.  6. The inferior vena cava  is dilated in size  with <50% respiratory variability, suggesting right atrial pressure of 15 mmHg. Comparison(s): No prior Echocardiogram. Conclusion(s)/Recommendation(s): Normal biventricular function without evidence of hemodynamically significant valvular heart disease. FINDINGS  Left Ventricle: Limited windows and study. Left ventricular ejection fraction, by estimation, is 60 to 65%. The left ventricle has normal function. The left ventricle has no regional wall motion abnormalities. The left ventricular internal cavity size was normal in size. There is no left ventricular hypertrophy. Left ventricular diastolic parameters are indeterminate. Right Ventricle: The right ventricular size is not well visualized. Right ventricular systolic function is normal. Tricuspid regurgitation signal is inadequate for assessing PA pressure. Left Atrium: Left atrial size was normal in size. Right Atrium: Right atrial size was normal in size. Pericardium: There is no evidence of pericardial effusion. Mitral Valve: Mild mitral valve regurgitation. Tricuspid Valve: Tricuspid valve regurgitation is not demonstrated. Aortic Valve: The aortic valve was not well visualized. Aortic valve regurgitation is not visualized. Pulmonic Valve: The pulmonic valve was grossly normal. Pulmonic valve regurgitation is not visualized. Aorta: The aortic root and ascending aorta are structurally normal, with no evidence of dilitation. Venous: The inferior vena cava is dilated in size with less than 50% respiratory variability, suggesting right atrial pressure of 15 mmHg. IAS/Shunts: The interatrial septum was not well visualized. Additional Comments: There is a moderate pleural effusion.  LEFT VENTRICLE PLAX 2D LVIDd:         3.40 cm     Diastology LVIDs:         2.20 cm     LV e' medial:    5.03 cm/s LV PW:         0.90 cm     LV E/e' medial:  17.6 LV IVS:        0.90 cm     LV e' lateral:   4.46 cm/s LVOT diam:     1.80 cm     LV E/e' lateral: 19.9  LV SV:         28 LV SV Index:   19 LVOT Area:     2.54 cm  LV Volumes (MOD) LV vol d, MOD A4C: 34.0 ml LV vol s, MOD A4C: 11.5 ml LV SV MOD A4C:     34.0 ml RIGHT VENTRICLE            IVC RV S prime:     7.14 cm/s  IVC diam: 2.60 cm LEFT ATRIUM           Index        RIGHT ATRIUM           Index LA diam:      2.90 cm 1.97 cm/m   RA Area:     10.10 cm LA Vol (A4C): 19.6 ml 13.28 ml/m  RA Volume:   19.50 ml  13.22 ml/m  AORTIC VALVE             PULMONIC VALVE LVOT Vmax:   61.70 cm/s  PR End Diast Vel: 1.77 msec LVOT Vmean:  40.400 cm/s LVOT VTI:    0.111 m  AORTA Ao Root diam: 2.60 cm Ao Asc diam:  2.90 cm MITRAL VALVE MV Area (PHT): 5.02 cm    SHUNTS MV Decel Time: 151 msec    Systemic VTI:  0.11 m MV E velocity: 88.60 cm/s  Systemic Diam: 1.80 cm MV A velocity: 60.90 cm/s MV E/A ratio:  1.45 Landscape architect signed by Phineas Inches Signature Date/Time: 03/21/2022/12:19:34 PM    Final  US Abdomen Limited RUQ (LIVER/GB)  Result Date: 03/21/2022 CLINICAL DATA:  Liver failure EXAM: ULTRASOUND ABDOMEN LIMITED RIGHT UPPER QUADRANT COMPARISON:  CT abdomen pelvis 03/20/2022 FINDINGS: Gallbladder: Gallstones: None Sludge: None Gallbladder Wall: Within normal limits Pericholecystic fluid: None Sonographic Murphy's Sign: Negative per technologist Common bile duct: Diameter: 3 mm Liver: Parenchymal echogenicity: Within normal limits Contours: Normal Lesions: None Portal vein: Patent.  Hepatopetal flow Other: Trace perihepatic ascites. Minimal right pleural effusion partially visualized. IMPRESSION: 1. No significant sonographic abnormality of the gallbladder or liver. 2. Trace perihepatic ascites and minimal right pleural effusion. Electronically Signed   By: Acquanetta BellingFarhaan  Mir M.D.   On: 03/21/2022 09:19   DG Tibia/Fibula Right  Result Date: 03/20/2022 CLINICAL DATA:  Known tibial and fibular fractures following reduction, initial encounter EXAM: RIGHT TIBIA AND FIBULA - 2 VIEW COMPARISON:  Film from earlier  in the same day. FINDINGS: Casting material is now noted in place. Previously seen tibial fracture has been reduced somewhat. Proximal fibular fracture is again noted and stable. No new focal abnormality is noted. IMPRESSION: Slight reduction of tibial fracture site. Casting material is noted. Electronically Signed   By: Alcide CleverMark  Lukens M.D.   On: 03/20/2022 21:40   CT CHEST ABDOMEN PELVIS W CONTRAST  Result Date: 03/20/2022 CLINICAL DATA:  Fall, found on floor EXAM: CT CHEST, ABDOMEN, AND PELVIS WITH CONTRAST TECHNIQUE: Multidetector CT imaging of the chest, abdomen and pelvis was performed following the standard protocol during bolus administration of intravenous contrast. RADIATION DOSE REDUCTION: This exam was performed according to the departmental dose-optimization program which includes automated exposure control, adjustment of the mA and/or kV according to patient size and/or use of iterative reconstruction technique. CONTRAST:  100mL OMNIPAQUE IOHEXOL 300 MG/ML  SOLN COMPARISON:  CT abdomen pelvis, 03/18/2010 FINDINGS: CT CHEST FINDINGS Cardiovascular: Aortic atherosclerosis. Normal heart size. No pericardial effusion. Mediastinum/Nodes: No enlarged mediastinal, hilar, or axillary lymph nodes. Thyroid gland, trachea, and esophagus demonstrate no significant findings. Lungs/Pleura: Severe emphysema. Mild, diffuse bilateral bronchial wall thickening. Interlobular septal thickening. Small bilateral pleural effusions and associated atelectasis or consolidation. Musculoskeletal: No chest wall abnormality. Mildly displaced, acute fractures of the lateral and posterior left seventh through twelfth ribs. CT ABDOMEN PELVIS FINDINGS Hepatobiliary: No solid liver abnormality is seen. No gallstones, gallbladder wall thickening, or biliary dilatation. Pancreas: Unremarkable. No pancreatic ductal dilatation or surrounding inflammatory changes. Spleen: Normal in size without significant abnormality. Adrenals/Urinary  Tract: Adrenal glands are unremarkable. 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 The left kidney is normal, without renal calculi, solid lesion, or hydronephrosis. Bladder is unremarkable. Stomach/Bowel: Stomach is within normal limits. Appendix appears normal. No evidence of bowel wall thickening, distention, or inflammatory changes. Vascular/Lymphatic: Aortic atherosclerosis. No enlarged abdominal or pelvic lymph nodes. Reproductive: No mass or other abnormality. Other: No abdominal wall hernia or abnormality. Small volume perihepatic ascites. Musculoskeletal: No acute osseous findings. IMPRESSION: 1. Mildly displaced, acute fractures of the lateral and posterior left seventh through twelfth ribs. 2. Small bilateral pleural effusions and associated atelectasis or consolidation. No associated pneumothorax. 3. Diffuse bilateral bronchial wall thickening and interlobular septal thickening, most consistent with pulmonary edema. 4. Severe emphysema. 5. Small volume simple fluid attenuation perihepatic ascites. No direct CT evidence of abdominal or pelvic organ injury. 6. Solitary left kidney. Aortic Atherosclerosis (ICD10-I70.0) and Emphysema (ICD10-J43.9). Electronically Signed   By: Jearld LeschAlex D Bibbey M.D.   On: 03/20/2022 17:32   CT HEAD WO CONTRAST  Result Date: 03/20/2022 CLINICAL DATA:  Head trauma, moderate-severe; Polytrauma,  blunt. Fall EXAM: CT HEAD WITHOUT CONTRAST CT CERVICAL SPINE WITHOUT CONTRAST TECHNIQUE: Multidetector CT imaging of the head and cervical spine was performed following the standard protocol without intravenous contrast. Multiplanar CT image reconstructions of the cervical spine were also generated. RADIATION DOSE REDUCTION: This exam was performed according to the departmental dose-optimization program which includes automated exposure control, adjustment of the mA and/or kV according to patient size and/or use of iterative reconstruction technique. COMPARISON:  None  Available. FINDINGS: CT HEAD FINDINGS Brain: No evidence of large-territorial acute infarction. No parenchymal hemorrhage. No mass lesion. No extra-axial collection. No mass effect or midline shift. No hydrocephalus. Basilar cisterns are patent. Vascular: No hyperdense vessel. Atherosclerotic calcifications are present within the cavernous internal carotid arteries. Skull: No acute fracture or focal lesion. Sinuses/Orbits: Paranasal sinuses and mastoid air cells are clear. Bilateral lens replacement. Otherwise the orbits are unremarkable. Other: None. CT CERVICAL SPINE FINDINGS Alignment: Normal. Skull base and vertebrae: No acute fracture. No aggressive appearing focal osseous lesion or focal pathologic process. Soft tissues and spinal canal: No prevertebral fluid or swelling. No visible canal hematoma. Upper chest: Emphysematous changes. Interlobular septal wall thickening. Other: None. IMPRESSION: 1. No acute intracranial abnormality. 2. No acute displaced fracture or traumatic listhesis of the cervical spine. 3.  Emphysema (ICD10-J43.9). 4. Possible pulmonary edema. Please see separately dictated CT chest 03/20/2022. Electronically Signed   By: Iven Finn M.D.   On: 03/20/2022 17:14   CT CERVICAL SPINE WO CONTRAST  Result Date: 03/20/2022 CLINICAL DATA:  Head trauma, moderate-severe; Polytrauma, blunt. Fall EXAM: CT HEAD WITHOUT CONTRAST CT CERVICAL SPINE WITHOUT CONTRAST TECHNIQUE: Multidetector CT imaging of the head and cervical spine was performed following the standard protocol without intravenous contrast. Multiplanar CT image reconstructions of the cervical spine were also generated. RADIATION DOSE REDUCTION: This exam was performed according to the departmental dose-optimization program which includes automated exposure control, adjustment of the mA and/or kV according to patient size and/or use of iterative reconstruction technique. COMPARISON:  None Available. FINDINGS: CT HEAD FINDINGS Brain:  No evidence of large-territorial acute infarction. No parenchymal hemorrhage. No mass lesion. No extra-axial collection. No mass effect or midline shift. No hydrocephalus. Basilar cisterns are patent. Vascular: No hyperdense vessel. Atherosclerotic calcifications are present within the cavernous internal carotid arteries. Skull: No acute fracture or focal lesion. Sinuses/Orbits: Paranasal sinuses and mastoid air cells are clear. Bilateral lens replacement. Otherwise the orbits are unremarkable. Other: None. CT CERVICAL SPINE FINDINGS Alignment: Normal. Skull base and vertebrae: No acute fracture. No aggressive appearing focal osseous lesion or focal pathologic process. Soft tissues and spinal canal: No prevertebral fluid or swelling. No visible canal hematoma. Upper chest: Emphysematous changes. Interlobular septal wall thickening. Other: None. IMPRESSION: 1. No acute intracranial abnormality. 2. No acute displaced fracture or traumatic listhesis of the cervical spine. 3.  Emphysema (ICD10-J43.9). 4. Possible pulmonary edema. Please see separately dictated CT chest 03/20/2022. Electronically Signed   By: Iven Finn M.D.   On: 03/20/2022 17:14   DG Pelvis Portable  Result Date: 03/20/2022 CLINICAL DATA:  Trauma EXAM: PORTABLE PELVIS 1-2 VIEWS COMPARISON:  March 09, 2010 FINDINGS: Evaluation is limited by technique. No pelvic diastasis. Degenerative changes of bilateral hips. Limited assessment of the sacrum secondary to overlapping bowel contents. No definitive acute displaced fracture is visualized. IMPRESSION: Limited evaluation due to technique. No definitive acute displaced fracture is visualized. If persistent clinical concern, recommend dedicated cross-sectional imaging or additional radiographic views. Electronically Signed   By: Valentino Saxon M.D.  On: 03/20/2022 16:02   DG Chest Port 1 View  Result Date: 03/20/2022 CLINICAL DATA:  Trauma EXAM: PORTABLE CHEST 1 VIEW COMPARISON:   March 18, 2010 FINDINGS: The cardiomediastinal silhouette is enlarged in contour, increased since 2011.Atherosclerotic calcifications. No pleural effusion. No pneumothorax. Diffuse coarse interstitial opacities. Questionable nodular opacity at the RIGHT apex. There are several age indeterminate LEFT-sided rib fractures of the approximate fifth and sixth ribs. IMPRESSION: 1. Age indeterminate LEFT-sided rib fractures. Recommend correlation with point tenderness. No pneumothorax is identified. 2. Questionable RIGHT apical nodular opacity versus summation artifact. Consider PA and lateral chest radiograph versus dedicated CT scan for improved evaluation. 3. Increased cardiomegaly in comparison to prior from 2011. 4. Diffuse coarse reticulation may reflect a degree of underlying interstitial lung disease or pulmonary emphysema. Electronically Signed   By: Valentino Saxon M.D.   On: 03/20/2022 16:01   DG Tibia/Fibula Right Port  Result Date: 03/20/2022 CLINICAL DATA:  Blunt Trauma EXAM: PORTABLE RIGHT TIBIA AND FIBULA - 2 VIEW COMPARISON:  None Available. FINDINGS: Osteopenia. There is an oblique fracture of the distal tibial shaft with minimal lateral displacement of the distal fragment. Nondisplaced component extends inferiorly. There is a minimally displaced fracture of the fibular head. No unexpected radiopaque foreign body. Soft tissue edema. IMPRESSION: Minimally displaced oblique fracture of the distal tibial shaft and a minimally displaced fracture of the fibular head. Electronically Signed   By: Valentino Saxon M.D.   On: 03/20/2022 15:57    Microbiology: Results for orders placed or performed during the hospital encounter of 03/20/22  Resp Panel by RT-PCR (Flu A&B, Covid) Anterior Nasal Swab     Status: None   Collection Time: 03/20/22  3:20 PM   Specimen: Anterior Nasal Swab  Result Value Ref Range Status   SARS Coronavirus 2 by RT PCR NEGATIVE NEGATIVE Final    Comment:  (NOTE) SARS-CoV-2 target nucleic acids are NOT DETECTED.  The SARS-CoV-2 RNA is generally detectable in upper respiratory specimens during the acute phase of infection. The lowest concentration of SARS-CoV-2 viral copies this assay can detect is 138 copies/mL. A negative result does not preclude SARS-Cov-2 infection and should not be used as the sole basis for treatment or other patient management decisions. A negative result may occur with  improper specimen collection/handling, submission of specimen other than nasopharyngeal swab, presence of viral mutation(s) within the areas targeted by this assay, and inadequate number of viral copies(<138 copies/mL). A negative result must be combined with clinical observations, patient history, and epidemiological information. The expected result is Negative.  Fact Sheet for Patients:  EntrepreneurPulse.com.au  Fact Sheet for Healthcare Providers:  IncredibleEmployment.be  This test is no t yet approved or cleared by the [place] FDA and  has been authorized for detection and/or diagnosis of SARS-CoV-2 by FDA under an Emergency Use Authorization (EUA). This EUA will remain  in effect (meaning this test can be used) for the duration of the COVID-19 declaration under Section 564(b)(1) of the Act, 21 U.S.C.section 360bbb-3(b)(1), unless the authorization is terminated  or revoked sooner.       Influenza A by PCR NEGATIVE NEGATIVE Final   Influenza B by PCR NEGATIVE NEGATIVE Final    Comment: (NOTE) The Xpert Xpress SARS-CoV-2/FLU/RSV plus assay is intended as an aid in the diagnosis of influenza from Nasopharyngeal swab specimens and should not be used as a sole basis for treatment. Nasal washings and aspirates are unacceptable for Xpert Xpress SARS-CoV-2/FLU/RSV testing.  Fact Sheet for Patients: EntrepreneurPulse.com.au  Fact Sheet for Healthcare  Providers: IncredibleEmployment.be  This test is not yet approved or cleared by the [place] FDA and has been authorized for detection and/or diagnosis of SARS-CoV-2 by FDA under an Emergency Use Authorization (EUA). This EUA will remain in effect (meaning this test can be used) for the duration of the COVID-19 declaration under Section 564(b)(1) of the Act, 21 U.S.C. section 360bbb-3(b)(1), unless the authorization is terminated or revoked.  Performed at [facility], [address], [place], [place] [address]   Surgical pcr screen     Status: Abnormal   Collection Time: 03/22/22 10:53 PM   Specimen: Nasal Mucosa; Nasal Swab  Result Value Ref Range Status   MRSA, PCR NEGATIVE NEGATIVE Final   Staphylococcus aureus POSITIVE (A) NEGATIVE Final    Comment: (NOTE) The Xpert SA Assay (FDA approved for NASAL specimens in patients 73 years of age and older), is one component of a comprehensive surveillance program. It is not intended to diagnose infection nor to guide or monitor treatment. Performed at [facility], [place] [address] [place], [place] [address]   SARS Coronavirus 2 by RT PCR (hospital order, performed in [facility] hospital lab) *cepheid single result test* Anterior Nasal Swab     Status: None   Collection Time: 03/29/22  3:06 PM   Specimen: Anterior Nasal Swab  Result Value Ref Range Status   SARS Coronavirus 2 by RT PCR NEGATIVE NEGATIVE Final    Comment: (NOTE) SARS-CoV-2 target nucleic acids are NOT DETECTED.  The SARS-CoV-2 RNA is generally detectable in upper and lower respiratory specimens during the acute phase of infection. The lowest concentration of SARS-CoV-2 viral copies this assay can detect is 250 copies / mL. A negative result does not preclude SARS-CoV-2 infection and should not be used as the sole basis for treatment or other patient management decisions.  A negative result may occur with improper specimen collection /  handling, submission of specimen other than nasopharyngeal swab, presence of viral mutation(s) within the areas targeted by this assay, and inadequate number of viral copies (<250 copies / mL). A negative result must be combined with clinical observations, patient history, and epidemiological information.  Fact Sheet for Patients:   https://www.patel.info/  Fact Sheet for Healthcare Providers: https://hall.com/  This test is not yet approved or  cleared by the [place] FDA and has been authorized for detection and/or diagnosis of SARS-CoV-2 by FDA under an Emergency Use Authorization (EUA).  This EUA will remain in effect (meaning this test can be used) for the duration of the COVID-19 declaration under Section 564(b)(1) of the Act, 21 U.S.C. section 360bbb-3(b)(1), unless the authorization is terminated or revoked sooner.  Performed at [facility], [place] [address], [place], [place] [address]    Labs: CBC: Recent Labs  Lab 03/24/22 0257 03/25/22 0247 03/25/22 1036 03/26/22 0226  WBC 7.5 7.1 7.6 6.1  HGB 12.5 10.8* 11.4* 10.6*  HCT 36.7 31.8* 34.0* 32.4*  MCV 100.8* 101.9* 103.0* 105.2*  PLT 113* 99* 107* [id number]*   Basic Metabolic Panel: Recent Labs  Lab 03/24/22 0257 03/25/22 0247 03/26/22 0226 03/30/22 0609  NA 134* 137 137  --   K 3.8 3.4* 4.9  --   CL 91* 97* 104  --   CO2 34* 30 30  --   GLUCOSE 147* 162* 101*  --   BUN 19 17 17   --   CREATININE 1.30* 1.09* 0.99 0.98  CALCIUM 8.5* 8.4* 8.6*  --  MG  --  1.9 2.1  --    Liver Function Tests: Recent Labs  Lab 03/24/22 0257 03/25/22 0247 03/26/22 0226  AST 54* 28 26  ALT 105* 33 21  ALKPHOS 65 67 79  BILITOT 0.9 0.8 1.2  PROT 5.1* 4.5* 4.8*  ALBUMIN 2.8* 2.5* 2.6*   CBG: No results for input(s): "GLUCAP" in the last 168 hours.  Discharge time spent: greater than 30 minutes.  Signed: Berle Mull, MD Addended by  Marzetta Board, MD [facility]

## 2022-03-29 NOTE — Progress Notes (Signed)
Occupational Therapy Treatment Patient Details Name: Valerie Bradley MRN: [id number] DOB: Jan 14, 1956 Today's Date: 03/29/2022   History of present illness 67 y.o. female presents to [facility] hospital on 03/20/2022 after a fall, with likely overdose. Pt found to have R distal tibia fx and minimally displaced fibula fx. Chest x-ray with fractures of 5 ribs on left side. Pt underwent IM nailing of RLE on 03/23/2022. PMH includes anxiety, OA, opiate abuse.   OT comments  Pt progressing towards established OT goals. Providing education on donning LLE into pants first to optimize independence and safety. Pt managing CAM boot with Min A for more distal straps. Pt performing functional mobility in hallway with RW and toileting with Min guard A. Continue to recommend dc to SNF and will continue to follow acutely as admitted. .   Recommendations for follow up therapy are one component of a multi-disciplinary discharge planning process, led by the attending physician.  Recommendations may be updated based on patient status, additional functional criteria and insurance authorization.    Follow Up Recommendations  Skilled nursing-short term rehab (<3 hours/day)    Assistance Recommended at Discharge Frequent or constant Supervision/Assistance  Patient can return home with the following      Equipment Recommendations  BSC/3in1    Recommendations for Other Services      Precautions / Restrictions Precautions Precautions: Fall Required Braces or Orthoses: Other Brace Other Brace: CAM boot RLE Restrictions Weight Bearing Restrictions: Yes RLE Weight Bearing: Weight bearing as tolerated Other Position/Activity Restrictions: WBAT inCAM       Mobility Bed Mobility Overal bed mobility: Needs Assistance Bed Mobility: Supine to Sit, Sit to Supine     Supine to sit: Supervision, HOB elevated Sit to supine: Supervision, HOB elevated   General bed mobility comments: Supervision for safety. Increased  time.    Transfers Overall transfer level: Needs assistance Equipment used: Rolling walker (2 wheels) Transfers: Sit to/from Stand Sit to Stand: Min guard           General transfer comment: Min guard for safety.     Balance Overall balance assessment: Needs assistance Sitting-balance support: Feet supported, No upper extremity supported Sitting balance-Leahy Scale: Good Sitting balance - Comments: Able to reach down and doff CAM boot.   Standing balance support: During functional activity, Reliant on assistive device for balance Standing balance-Leahy Scale: Fair Standing balance comment: Able to stand at sink for hand hygiene without UE support                           ADL either performed or assessed with clinical judgement   ADL Overall ADL's : Needs assistance/impaired     Grooming: Wash/dry hands;Min guard;Standing               Lower Body Dressing: Minimal assistance;Sit to/from stand Lower Body Dressing Details (indicate cue type and reason): Educating pt on donning LLE into pants first. Pt requiring repeated explaination for understanding why to don LLE first. Pt donning CAM boot with MIn A for managing distal part of boot. Pt adjusting her socks in bed. Toilet Transfer: Min guard;Rolling walker (2 wheels);Ambulation;BSC/3in1   Toileting- Clothing Manipulation and Hygiene: Supervision/safety;Sitting/lateral lean       Functional mobility during ADLs: Min guard;Rolling walker (2 wheels) General ADL Comments: Pt performing functional mobility in hallway, toileting, and managing of CAM boot    Extremity/Trunk Assessment Upper Extremity Assessment Upper Extremity Assessment: Overall WFL for tasks assessed  Lower Extremity Assessment Lower Extremity Assessment: Defer to PT evaluation RLE Deficits / Details: 2+/5 ankle PF/DF, 2+/5 knee extension, 3/5 hip flexion/abduction/adduction RLE Sensation: decreased light touch        Vision        Perception     Praxis      Cognition Arousal/Alertness: Awake/alert Behavior During Therapy: WFL for tasks assessed/performed Overall Cognitive Status: Impaired/Different from baseline Area of Impairment: Safety/judgement                     Memory: Decreased short-term memory   Safety/Judgement: Decreased awareness of safety     General Comments: Cues for safety and problem solving how to manage boot        Exercises      Shoulder Instructions       General Comments      Pertinent Vitals/ Pain       Pain Assessment Pain Assessment: Faces Faces Pain Scale: Hurts little more Breathing: normal Negative Vocalization: none Facial Expression: smiling or inexpressive Body Language: relaxed Consolability: no need to console PAINAD Score: 0 Pain Location: Rt anterior knee Pain Descriptors / Indicators: Sore Pain Intervention(s): Monitored during session, Limited activity within patient's tolerance, Repositioned  Home Living                                          Prior Functioning/Environment              Frequency  Min 2X/week        Progress Toward Goals  OT Goals(current goals can now be found in the care plan section)  Progress towards OT goals: Progressing toward goals  Acute Rehab OT Goals OT Goal Formulation: With patient Time For Goal Achievement: 04/07/22 Potential to Achieve Goals: Good ADL Goals Pt Will Perform Grooming: with set-up;with supervision;standing Pt Will Perform Lower Body Dressing: with set-up;with supervision;sit to/from stand Pt Will Transfer to Toilet: with supervision;ambulating;bedside commode Pt Will Perform Toileting - Clothing Manipulation and hygiene: with supervision;sit to/from stand;sitting/lateral leans Additional ADL Goal #1: Pt will perform bed mobility with Supervision in preparation for ADLs  Plan Discharge plan remains appropriate    Co-evaluation                 AM-PAC  OT "6 Clicks" Daily Activity     Outcome Measure   Help from another person eating meals?: None Help from another person taking care of personal grooming?: A Little Help from another person toileting, which includes using toliet, bedpan, or urinal?: A Little Help from another person bathing (including washing, rinsing, drying)?: A Little Help from another person to put on and taking off regular upper body clothing?: A Little Help from another person to put on and taking off regular lower body clothing?: A Little 6 Click Score: 19    End of Session Equipment Utilized During Treatment: Rolling walker (2 wheels);Gait belt  OT Visit Diagnosis: Unsteadiness on feet (R26.81);Other abnormalities of gait and mobility (R26.89);Muscle weakness (generalized) (M62.81)   Activity Tolerance Patient tolerated treatment well   Patient Left with call bell/phone within reach;in bed   Nurse Communication Mobility status;Weight bearing status        Time: 4401-0272 OT Time Calculation (min): 29 min  Charges: OT General Charges $OT Visit: 1 Visit OT Treatments $Self Care/Home Management : 23-37 mins  [place], OTR/L Acute Rehab Office:  [phone]  Theodoro Grist Jessenya Berdan 03/29/2022, 5:05 PM

## 2022-03-29 NOTE — TOC Progression Note (Addendum)
Transition of Care [facility]) - Progression Note    Patient Details  Name: LILLYMAE DUET MRN: [id number] Date of Birth: Apr 08, 1956  [facility]) CM/SW Contact  Joanne Chars, LCSW Phone Number: 03/29/2022, 1:26 PM  Clinical Narrative:   CSW spoke with Jeani Hawking at [facility].  She has not reviewed referral yet, will call back once she has done so.  TC Butch Penny, [place]  She had questions about suboxone, needs to discuss with son also, but is considering pt again.  Will call back.   1455: TC Butch Penny, [place]  She can offer a bed for pt.  Will need a negative covid test.  Can take tomorrow.  MD informed.   Expected Discharge Plan: [place] Barriers to Discharge: Continued Medical Work up, Ship broker, SNF Pending bed offer  Expected Discharge Plan and Services Expected Discharge Plan: [place] Choice: [place] arrangements for the past 2 months: Single Family Home                                       Social Determinants of Health (SDOH) Interventions    Readmission Risk Interventions     No data to display

## 2022-03-29 NOTE — Care Management Important Message (Signed)
Important Message  Patient Details  Name: Valerie Bradley MRN: [id number] Date of Birth: 10/08/1955   Medicare Important Message Given:  Yes     Hannah Beat 03/29/2022, 10:52 AM

## 2022-03-30 LAB — CREATININE, SERUM
Creatinine, Ser: 0.98 mg/dL (ref 0.44–1.00)
GFR, Estimated: >60 mL/min (ref >60)

## 2022-03-30 NOTE — Progress Notes (Signed)
Patient seen this morning, stable for discharge to SNF.  Please refer to discharge summary by Dr. Posey Pronto on 10/10.  BP (!) 110/53 (BP Location: Left Arm)   Pulse 87   Temp 98 F (36.7 C)   Resp 17   Ht 5\' 2"  (1.575 m)   Wt 38.6 kg   SpO2 97%   BMI 15.55 kg/m   Valerie Bradley M. Cruzita Lederer, MD, PhD [facility]  Between 7 am - 7 pm you can contact me via Amion (for emergencies) or [place] (non urgent matters).  I am not available 7 pm - 7 am, please contact night coverage MD/APP via Amion

## 2022-03-30 NOTE — TOC Progression Note (Signed)
Transition of Care [facility]) - Progression Note    Patient Details  Name: Valerie Bradley MRN: [id number] Date of Birth: 12-12-1955  [facility]) CM/SW Contact  Joanne Chars, LCSW Phone Number: 03/30/2022, 11:03 AM  Clinical Narrative:   TC Donna/[place].  She requested DC summary, hard scripts, ortho notes, which were faxed.  She is ready to receive pt.  CSW spoke with son Darnelle Maffucci, he will be at the hospital to transport around noon.    Expected Discharge Plan: [place] Barriers to Discharge: Continued Medical Work up, Ship broker, SNF Pending bed offer  Expected Discharge Plan and Services Expected Discharge Plan: [place] Choice: [place] arrangements for the past 2 months: Single Family Home Expected Discharge Date: 03/30/22                                     Social Determinants of Health (SDOH) Interventions    Readmission Risk Interventions     No data to display

## 2022-03-30 NOTE — TOC Transition Note (Signed)
Transition of Care [facility]) - CM/SW Discharge Note   Patient Details  Name: Valerie Bradley MRN: [id number] Date of Birth: 09-24-1955  Transition of Care [facility]) CM/SW Contact:  Joanne Chars, LCSW Phone Number: 03/30/2022, 11:15 AM   Clinical Narrative:   Pt discharging to [facility], near [place].  RN call [phone] for report.  Son Valerie Bradley will be here around noon to transport pt.      Final next level of care: [place] Barriers to Discharge: Barriers Resolved   Patient Goals and CMS Choice Patient states their goals for this hospitalization and ongoing recovery are:: Pt would like to be able to return home with her dog. CMS Medicare.gov Compare Post Acute Care list provided to:: Patient Choice offered to / list presented to : Patient  Discharge Placement              Patient chooses bed at:  [facility]) Patient to be transferred to facility by: son Valerie Bradley Name of family member notified: son Valerie Bradley Patient and family notified of of transfer: 03/30/22  Discharge Plan and Services     Post Acute Care Choice: [place]                               Social Determinants of Health (SDOH) Interventions     Readmission Risk Interventions     No data to display
